# Patient Record
Sex: Female | Born: 2000 | Race: Black or African American | Hispanic: No | Marital: Single | Smoking: Never smoker
Health system: Southern US, Community
[De-identification: ages and names within clinical notes are randomized; demographics above are authoritative.]

## PROBLEM LIST (undated history)

## (undated) DIAGNOSIS — U071 COVID-19: Secondary | ICD-10-CM

## (undated) DIAGNOSIS — L309 Dermatitis, unspecified: Secondary | ICD-10-CM

## (undated) DIAGNOSIS — R519 Headache, unspecified: Secondary | ICD-10-CM

## (undated) DIAGNOSIS — J45909 Unspecified asthma, uncomplicated: Secondary | ICD-10-CM

## (undated) HISTORY — DX: Unspecified asthma, uncomplicated: J45.909

## (undated) HISTORY — DX: Dermatitis, unspecified: L30.9

## (undated) HISTORY — DX: Headache, unspecified: R51.9

## (undated) HISTORY — PX: TYMPANOSTOMY TUBE PLACEMENT: SHX32

---
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
## 2018-01-11 ENCOUNTER — Other Ambulatory Visit: Payer: Self-pay

## 2018-01-11 DIAGNOSIS — J4540 Moderate persistent asthma, uncomplicated: Secondary | ICD-10-CM

## 2018-01-11 MED ORDER — ALBUTEROL SULFATE HFA 108 (90 BASE) MCG/ACT IN AERS: INHALATION_SPRAY | RESPIRATORY_TRACT | 1 refills | Status: DC

## 2018-01-15 ENCOUNTER — Other Ambulatory Visit: Payer: Self-pay

## 2018-01-19 NOTE — Progress Notes (Signed)
VIALS EXP 01-21-19 

## 2018-01-19 NOTE — Addendum Note (Signed)
Addended by: Lorrin MaisPADGETT, Cedar Ditullio P on: 01/19/2018 12:56 PM   Modules accepted: Orders

## 2018-01-20 ENCOUNTER — Telehealth: Payer: Self-pay | Admitting: Allergy

## 2018-01-20 DIAGNOSIS — J4540 Moderate persistent asthma, uncomplicated: Secondary | ICD-10-CM

## 2018-01-20 DIAGNOSIS — H1010 Acute atopic conjunctivitis, unspecified eye: Secondary | ICD-10-CM

## 2018-01-20 DIAGNOSIS — J301 Allergic rhinitis due to pollen: Secondary | ICD-10-CM | POA: Diagnosis not present

## 2018-01-20 DIAGNOSIS — J309 Allergic rhinitis, unspecified: Secondary | ICD-10-CM

## 2018-01-20 MED ORDER — ALBUTEROL SULFATE HFA 108 (90 BASE) MCG/ACT IN AERS: INHALATION_SPRAY | RESPIRATORY_TRACT | 1 refills | Status: DC

## 2018-01-20 MED ORDER — OLOPATADINE HCL 0.2 % OP SOLN: 1.0000 [drp] | Freq: Every day | OPHTHALMIC | 3 refills | Status: DC

## 2018-01-20 MED ORDER — LEVOCETIRIZINE DIHYDROCHLORIDE 5 MG PO TABS: 5.0000 mg | ORAL_TABLET | Freq: Every evening | ORAL | 5 refills | Status: DC

## 2018-01-20 MED ORDER — MONTELUKAST SODIUM 10 MG PO TABS: 10.0000 mg | ORAL_TABLET | Freq: Every day | ORAL | 3 refills | Status: DC

## 2018-01-20 MED ORDER — ALBUTEROL SULFATE (2.5 MG/3ML) 0.083% IN NEBU: INHALATION_SOLUTION | RESPIRATORY_TRACT | 0 refills | Status: DC

## 2018-01-20 MED ORDER — BUDESONIDE-FORMOTEROL FUMARATE 160-4.5 MCG/ACT IN AERO: 2.0000 | INHALATION_SPRAY | Freq: Two times a day (BID) | RESPIRATORY_TRACT | 5 refills | Status: DC

## 2018-01-20 NOTE — Telephone Encounter (Signed)
[facility] [address] Sent scripts into pharmacy.

## 2018-01-20 NOTE — Telephone Encounter (Signed)
Pt mom called and said that the pharmacy sent rx to us and has not heard from us and she is out of her inhaler and having problems now. Need to send proair to [facility] [address]. [phone]

## 2018-01-21 ENCOUNTER — Other Ambulatory Visit: Payer: Self-pay | Admitting: *Deleted

## 2018-01-21 ENCOUNTER — Ambulatory Visit (INDEPENDENT_AMBULATORY_CARE_PROVIDER_SITE_OTHER): Payer: Medicaid Other | Admitting: *Deleted

## 2018-01-21 DIAGNOSIS — J309 Allergic rhinitis, unspecified: Secondary | ICD-10-CM

## 2018-01-21 MED ORDER — EPINEPHRINE 0.3 MG/0.3ML IJ SOAJ: INTRAMUSCULAR | 2 refills | Status: DC

## 2018-01-21 NOTE — Progress Notes (Signed)
Immunotherapy   Patient Details  Name: Tiffany Huffman MRN: [id number] Date of Birth: 10/31/2000  01/21/2018  Tiffany Huffman started injections for  pollen-pet/mold-cr-rw Following schedule: B  Frequency:1 time per week Epi-Pen:Prescription for Epinephrine sent in  Consent signed and patient instructions given.   Tiffany AlmMildred Cloyd Huffman 01/21/2018, 11:40 AM

## 2018-01-22 DIAGNOSIS — J3089 Other allergic rhinitis: Secondary | ICD-10-CM | POA: Diagnosis not present

## 2018-01-25 DIAGNOSIS — J3089 Other allergic rhinitis: Secondary | ICD-10-CM | POA: Diagnosis not present

## 2018-01-26 DIAGNOSIS — J301 Allergic rhinitis due to pollen: Secondary | ICD-10-CM | POA: Diagnosis not present

## 2018-01-29 ENCOUNTER — Ambulatory Visit (INDEPENDENT_AMBULATORY_CARE_PROVIDER_SITE_OTHER): Payer: Medicaid Other

## 2018-01-29 DIAGNOSIS — J309 Allergic rhinitis, unspecified: Secondary | ICD-10-CM | POA: Diagnosis not present

## 2018-02-08 IMAGING — US US RENAL
1 series · 14 of 25 positions shown · non-contrast
Comparison: None.

CLINICAL DATA: Dysuria

EXAM:
RENAL / URINARY TRACT ULTRASOUND COMPLETE

[Series 1: us renal · 0.25mm/px · 14 of 33 slices shown]
[im 1/33]
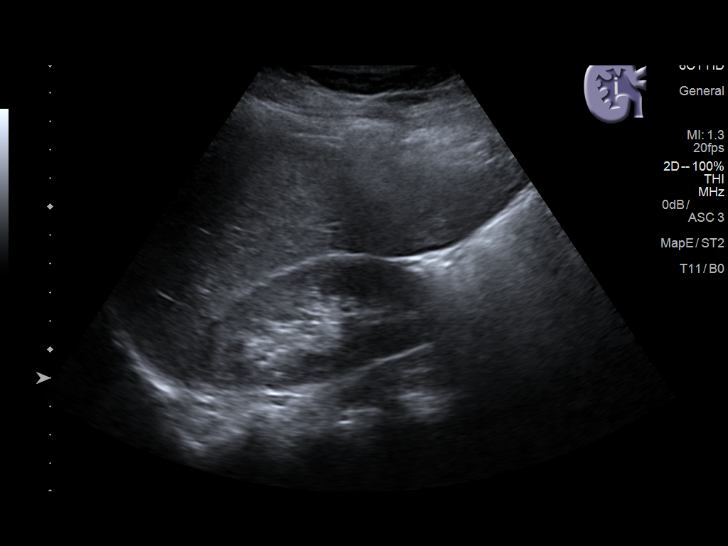
[im 3/33]
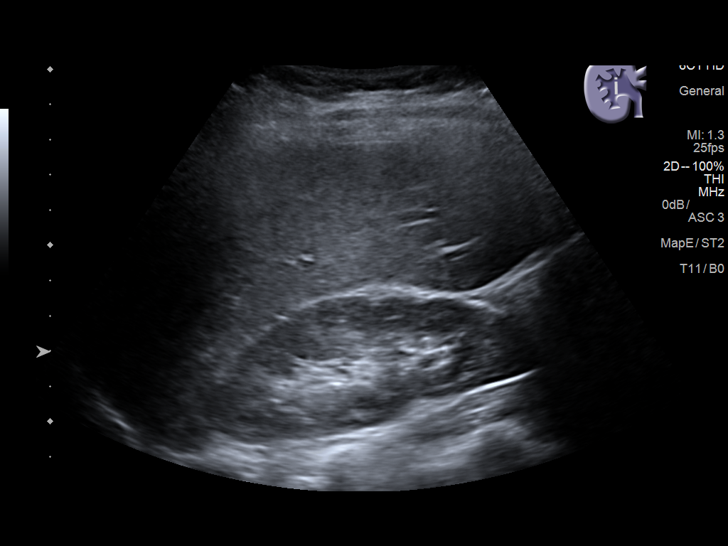
[im 6/33]
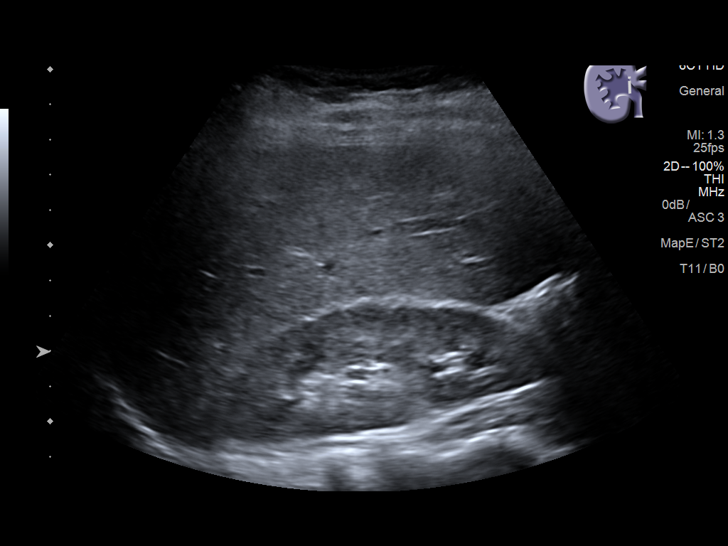
[im 9/33]
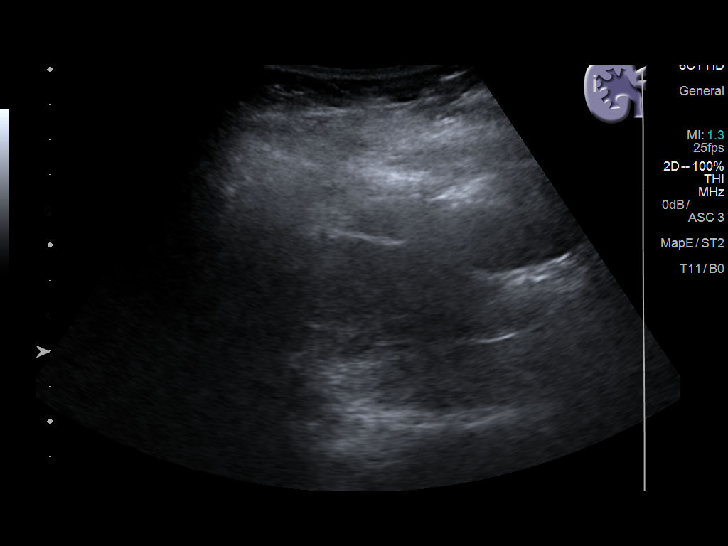
[im 11/33]
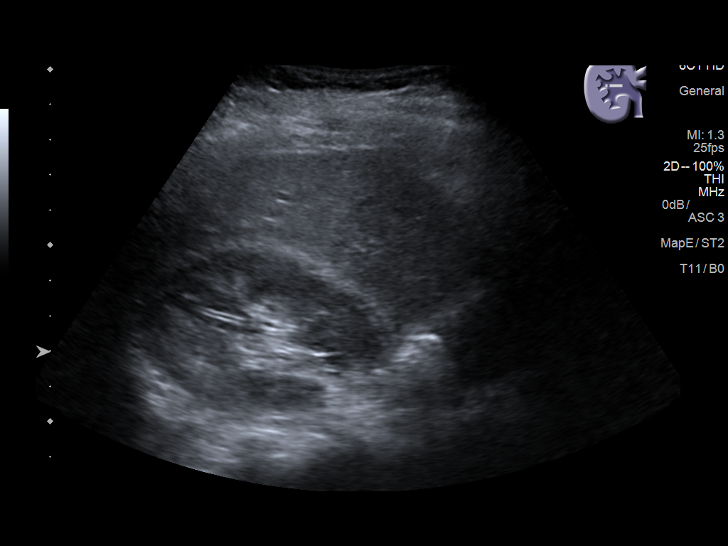
[im 13/33]
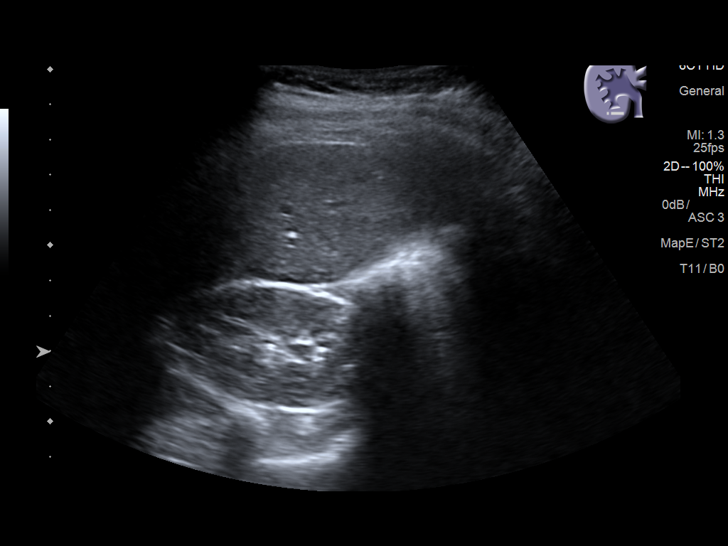
[im 15/33]
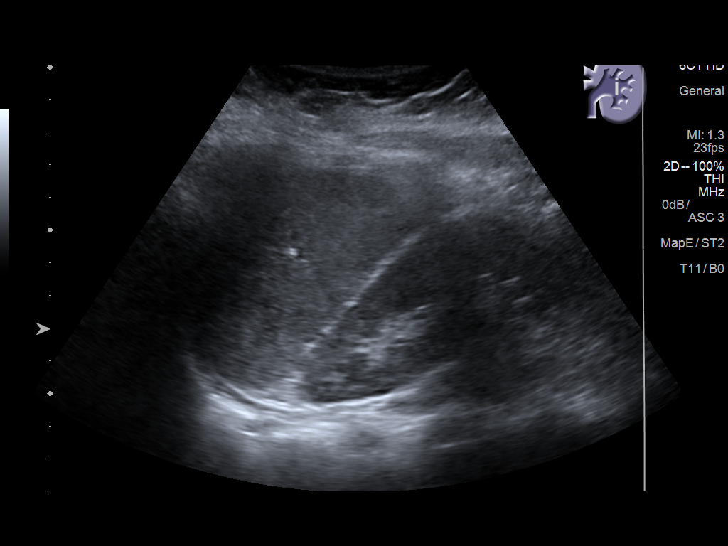
[im 18/33]
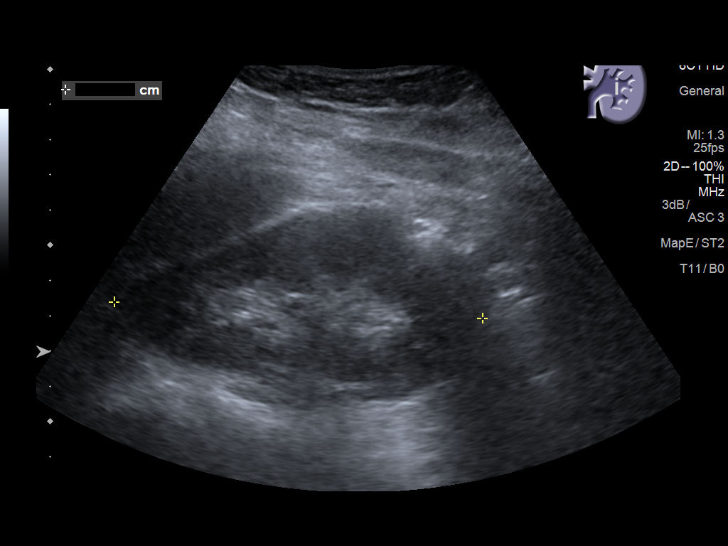
[im 21/33]
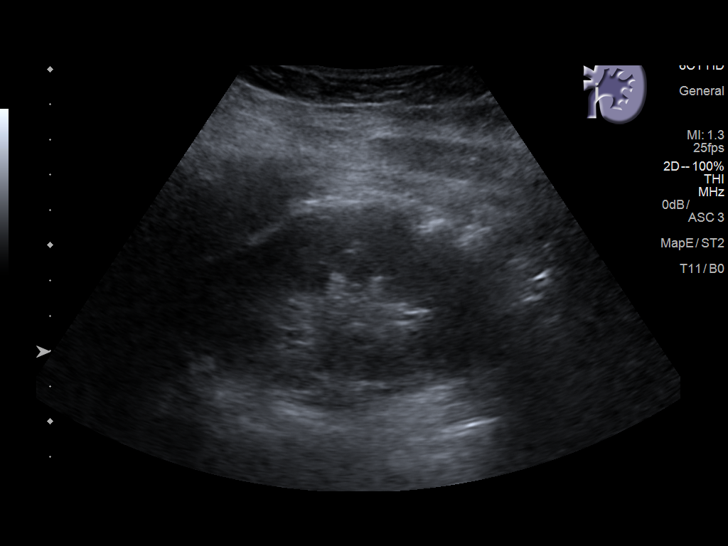
[im 22/33]
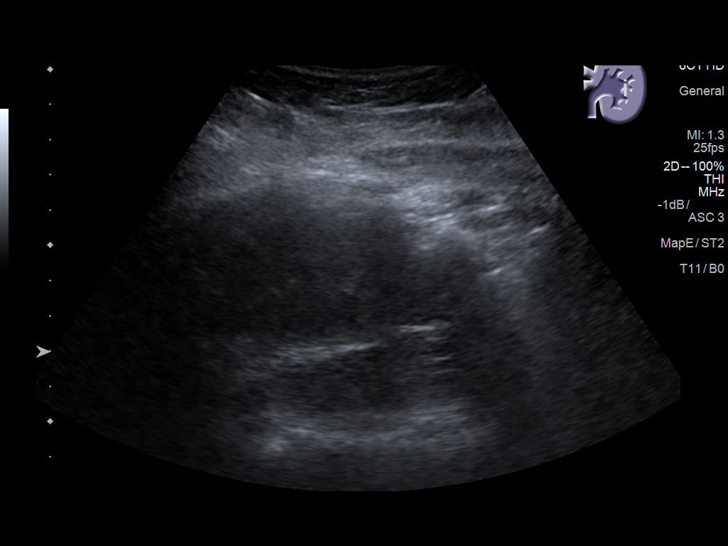
[im 25/33]
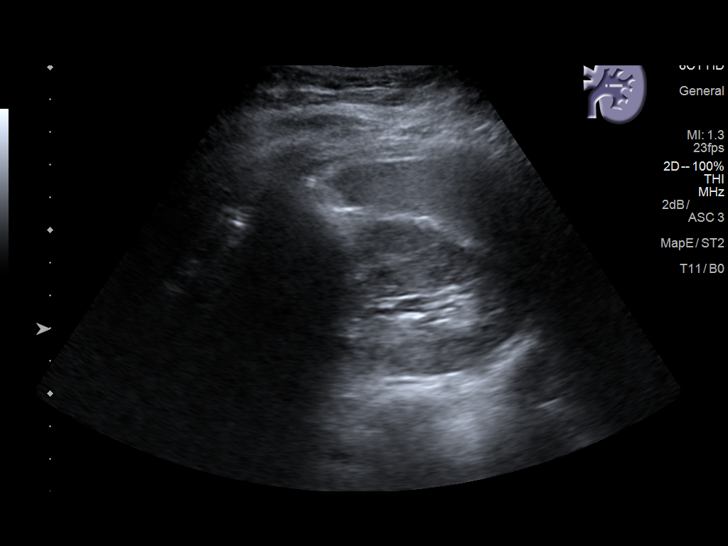
[im 27/33]
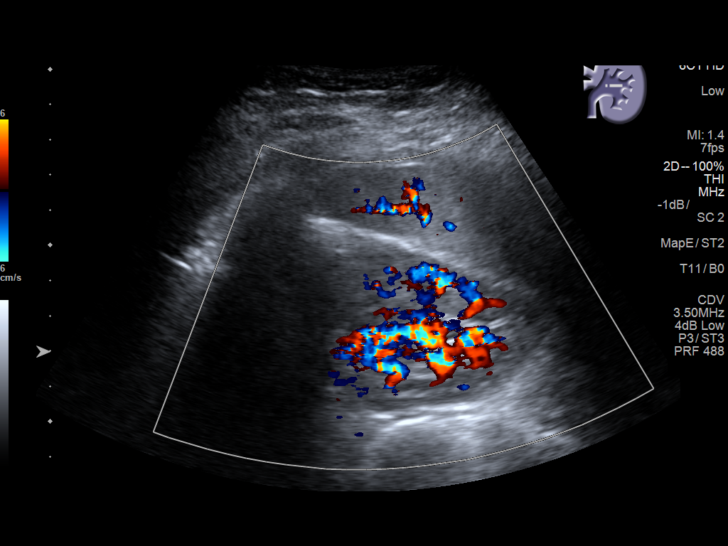
[im 30/33]
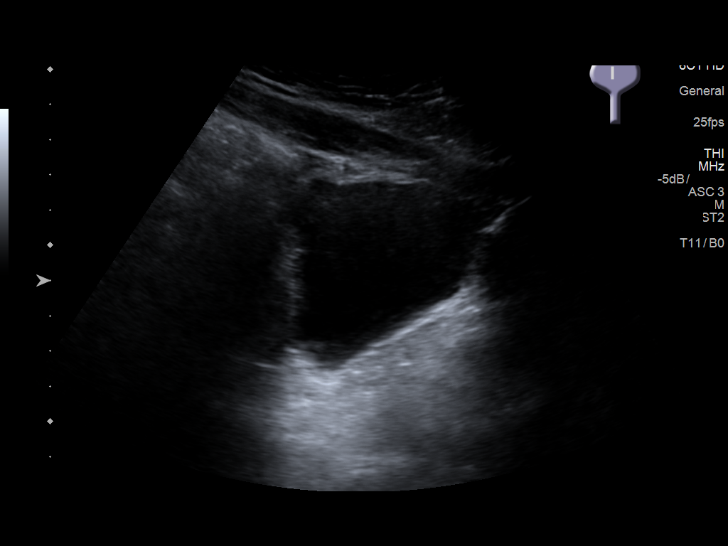
[im 33/33]
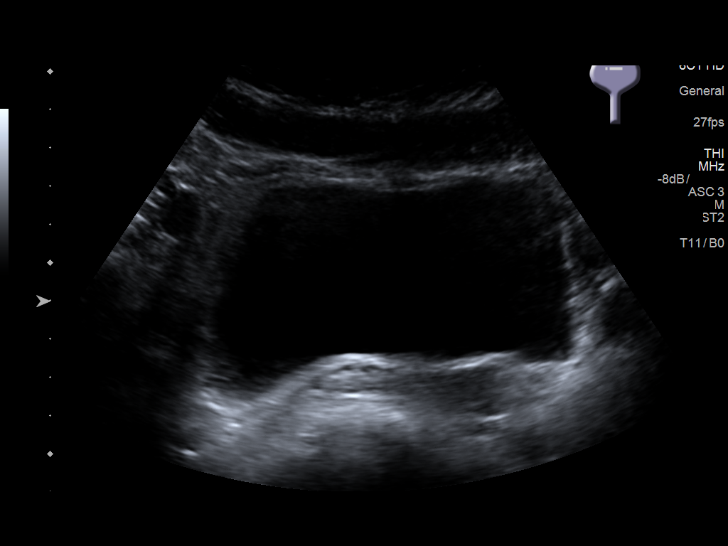

[14 of 25 positions shown; findings below may reference images not displayed]

FINDINGS: Right Kidney:

Length: 10.1 cm. Echogenicity within normal limits. No mass or
hydronephrosis visualized.

Left Kidney:

Length: 10.5 cm. Echogenicity within normal limits. No mass or
hydronephrosis visualized.

Bladder:

Appears normal for degree of bladder distention.
IMPRESSION: Negative renal ultrasound.

## 2018-02-17 ENCOUNTER — Ambulatory Visit (INDEPENDENT_AMBULATORY_CARE_PROVIDER_SITE_OTHER): Payer: Medicaid Other

## 2018-02-17 DIAGNOSIS — J309 Allergic rhinitis, unspecified: Secondary | ICD-10-CM | POA: Diagnosis not present

## 2018-02-26 ENCOUNTER — Ambulatory Visit (INDEPENDENT_AMBULATORY_CARE_PROVIDER_SITE_OTHER): Payer: Medicaid Other

## 2018-02-26 DIAGNOSIS — J309 Allergic rhinitis, unspecified: Secondary | ICD-10-CM | POA: Diagnosis not present

## 2018-02-27 ENCOUNTER — Other Ambulatory Visit (HOSPITAL_COMMUNITY)
Admission: RE | Admit: 2018-02-27 | Discharge: 2018-02-27 | Disposition: A | Discharge status: Home / Self Care | Payer: Medicaid Other | Source: Ambulatory Visit | Attending: Pediatrics | Admitting: Pediatrics

## 2018-02-27 ENCOUNTER — Other Ambulatory Visit: Payer: Self-pay

## 2018-02-27 DIAGNOSIS — B9689 Other specified bacterial agents as the cause of diseases classified elsewhere: Secondary | ICD-10-CM | POA: Diagnosis not present

## 2018-02-27 DIAGNOSIS — R300 Dysuria: Secondary | ICD-10-CM | POA: Diagnosis not present

## 2018-02-27 DIAGNOSIS — N898 Other specified noninflammatory disorders of vagina: Secondary | ICD-10-CM | POA: Insufficient documentation

## 2018-02-27 DIAGNOSIS — N760 Acute vaginitis: Secondary | ICD-10-CM | POA: Diagnosis not present

## 2018-02-27 LAB — URINALYSIS, ROUTINE W REFLEX MICROSCOPIC
Bilirubin Urine: NEGATIVE
Glucose, UA: NEGATIVE mg/dL
Hgb urine dipstick: NEGATIVE
Ketones, ur: NEGATIVE mg/dL
Nitrite: NEGATIVE
Protein, ur: NEGATIVE mg/dL
SPECIFIC GRAVITY, URINE: 1.015 (ref 1.005–1.030)
pH: 7 (ref 5.0–8.0)

## 2018-02-28 LAB — URINE CULTURE

## 2018-03-08 ENCOUNTER — Encounter (HOSPITAL_COMMUNITY): Payer: Self-pay | Admitting: Emergency Medicine

## 2018-03-08 ENCOUNTER — Emergency Department (HOSPITAL_COMMUNITY)
Admission: EM | Admit: 2018-03-08 | Discharge: 2018-03-08 | Disposition: A | Discharge status: Home / Self Care | Payer: Medicaid Other | Attending: Emergency Medicine | Admitting: Emergency Medicine

## 2018-03-08 ENCOUNTER — Other Ambulatory Visit: Payer: Self-pay

## 2018-03-08 DIAGNOSIS — Z5321 Procedure and treatment not carried out due to patient leaving prior to being seen by health care provider: Secondary | ICD-10-CM | POA: Insufficient documentation

## 2018-03-08 DIAGNOSIS — R1030 Lower abdominal pain, unspecified: Secondary | ICD-10-CM | POA: Insufficient documentation

## 2018-03-08 LAB — CBC
HCT: 41.2 % (ref 36.0–46.0)
Hemoglobin: 13.4 g/dL (ref 12.0–15.0)
MCH: 30.1 pg (ref 26.0–34.0)
MCHC: 32.5 g/dL (ref 30.0–36.0)
MCV: 92.6 fL (ref 78.0–100.0)
PLATELETS: 277 10*3/uL (ref 150–400)
RBC: 4.45 MIL/uL (ref 3.87–5.11)
RDW: 12.5 % (ref 11.5–15.5)
WBC: 10 10*3/uL (ref 4.0–10.5)

## 2018-03-08 LAB — I-STAT BETA HCG BLOOD, ED (MC, WL, AP ONLY): I-stat hCG, quantitative: <5 m[IU]/mL (ref <5)

## 2018-03-08 LAB — URINALYSIS, ROUTINE W REFLEX MICROSCOPIC
Bilirubin Urine: NEGATIVE
GLUCOSE, UA: NEGATIVE mg/dL
Hgb urine dipstick: NEGATIVE
KETONES UR: NEGATIVE mg/dL
LEUKOCYTES UA: NEGATIVE
Nitrite: NEGATIVE
PROTEIN: NEGATIVE mg/dL
Specific Gravity, Urine: 1.024 (ref 1.005–1.030)
pH: 6 (ref 5.0–8.0)

## 2018-03-08 LAB — COMPREHENSIVE METABOLIC PANEL
ALK PHOS: 52 U/L (ref 38–126)
ALT: 19 U/L (ref 0–44)
AST: 16 U/L (ref 15–41)
Albumin: 3.6 g/dL (ref 3.5–5.0)
Anion gap: 9 (ref 5–15)
BILIRUBIN TOTAL: 0.6 mg/dL (ref 0.3–1.2)
BUN: 8 mg/dL (ref 6–20)
CO2: 28 mmol/L (ref 22–32)
CREATININE: 0.83 mg/dL (ref 0.44–1.00)
Calcium: 9 mg/dL (ref 8.9–10.3)
Chloride: 103 mmol/L (ref 98–111)
GFR calc Af Amer: >60 mL/min (ref >60)
Glucose, Bld: 75 mg/dL (ref 70–99)
Potassium: 3.7 mmol/L (ref 3.5–5.1)
Sodium: 140 mmol/L (ref 135–145)
TOTAL PROTEIN: 6.9 g/dL (ref 6.5–8.1)

## 2018-03-08 LAB — LIPASE, BLOOD: Lipase: 32 U/L (ref 11–51)

## 2018-03-08 NOTE — ED Notes (Signed)
Pt came to front desk and asked if she could get her results sent to her and that she was leaving. Pt encouraged to stay but elected to leave and explained how to get into the mychart portal. Pt observed to be ambulating out of the ER NAD.

## 2018-03-08 NOTE — ED Triage Notes (Signed)
Pt c/o lower abdominal pain and back pain x 2 weeks. Pt reports that she was diagnosed with bacterial vaginosis, started on abx, reports pelvic pressure and frequent urination.

## 2018-03-09 NOTE — ED Notes (Signed)
Follow up call made  Pt will return if needed  03/09/18  1041  s Elania Crowl rn

## 2018-03-14 ENCOUNTER — Other Ambulatory Visit: Payer: Self-pay | Admitting: Allergy

## 2018-03-14 DIAGNOSIS — J4540 Moderate persistent asthma, uncomplicated: Secondary | ICD-10-CM

## 2018-03-16 ENCOUNTER — Ambulatory Visit (INDEPENDENT_AMBULATORY_CARE_PROVIDER_SITE_OTHER): Payer: Medicaid Other

## 2018-03-16 DIAGNOSIS — J309 Allergic rhinitis, unspecified: Secondary | ICD-10-CM

## 2018-03-24 ENCOUNTER — Ambulatory Visit (INDEPENDENT_AMBULATORY_CARE_PROVIDER_SITE_OTHER): Payer: Medicaid Other | Admitting: *Deleted

## 2018-03-24 ENCOUNTER — Other Ambulatory Visit: Payer: Self-pay | Admitting: Allergy & Immunology

## 2018-03-24 DIAGNOSIS — H1010 Acute atopic conjunctivitis, unspecified eye: Secondary | ICD-10-CM

## 2018-03-24 DIAGNOSIS — J309 Allergic rhinitis, unspecified: Secondary | ICD-10-CM | POA: Diagnosis not present

## 2018-03-31 ENCOUNTER — Encounter: Payer: Self-pay | Admitting: Family Medicine

## 2018-03-31 ENCOUNTER — Ambulatory Visit (INDEPENDENT_AMBULATORY_CARE_PROVIDER_SITE_OTHER): Payer: Medicaid Other | Admitting: Family Medicine

## 2018-03-31 ENCOUNTER — Other Ambulatory Visit (HOSPITAL_COMMUNITY)
Admission: RE | Admit: 2018-03-31 | Discharge: 2018-03-31 | Disposition: A | Discharge status: Home / Self Care | Payer: Medicaid Other | Source: Ambulatory Visit | Attending: Family Medicine | Admitting: Family Medicine

## 2018-03-31 VITALS — BP 126/73 | HR 72 | Wt 212.0 lb

## 2018-03-31 DIAGNOSIS — Z113 Encounter for screening for infections with a predominantly sexual mode of transmission: Secondary | ICD-10-CM

## 2018-03-31 DIAGNOSIS — R102 Pelvic and perineal pain unspecified side: Secondary | ICD-10-CM

## 2018-03-31 DIAGNOSIS — Z3009 Encounter for other general counseling and advice on contraception: Secondary | ICD-10-CM

## 2018-03-31 LAB — URINE CYTOLOGY ANCILLARY ONLY
Chlamydia: NEGATIVE
Neisseria Gonorrhea: NEGATIVE

## 2018-03-31 NOTE — Patient Instructions (Addendum)
You have constipation which is hard stools that are difficult to pass. It is important to have regular bowel movements every 1-3 days that are soft and easy to pass. Hard stools increase your risk of hemorrhoids and are very uncomfortable.   To prevent constipation you can increase the amount of fiber in your diet. Examples of foods with fiber are leafy greens, whole grain breads, oatmeal and other grains.  It is also important to drink at least eight 8oz glass of water everyday.   If you have not has a bowel movement in 4-5 days you made need to clean out your bowel.  This will have establish normal movement through your bowel.    Miralax Clean out  Take 8 capfuls of miralax in 64 oz of gatorade. You can use any fluid that appeals to you (gatorade, water, juice)  Continue to drink at least eight 8 oz glasses of water throughout the day  You can repeat with another 8 capfuls of miralax in 64 oz of gatorade if you are not having a large amount of stools  You will need to be at home and close to a bathroom for about 8 hours when you do the above as you may need to go to the bathroom frequently.   After you are cleaned out: - Start Colace100mg  twice daily - Start Miralax once daily - Start a daily fiber supplement like metamucil or citrucel - You can safely use enemas in pregnancy  - if you are having diarrhea you can reduce to Colace once a day or miralax every other day or a 1/2 capful daily.       Vaginitis Vaginitis is a condition in which the vaginal tissue swells and becomes red (inflamed). This condition is most often caused by a change in the normal balance of bacteria and yeast that live in the vagina. This change causes an overgrowth of certain bacteria or yeast, which causes the inflammation. There are different types of vaginitis, but the most common types are:  Bacterial vaginosis.  Yeast infection (candidiasis).  Trichomoniasis vaginitis. This is a sexually transmitted  disease (STD).  Viral vaginitis.  Atrophic vaginitis.  Allergic vaginitis.  What are the causes? The cause of this condition depends on the type of vaginitis. It can be caused by:  Bacteria (bacterial vaginosis).  Yeast, which is a fungus (yeast infection).  A parasite (trichomoniasis vaginitis).  A virus (viral vaginitis).  Low hormone levels (atrophic vaginitis). Low hormone levels can occur during pregnancy, breastfeeding, or after menopause.  Irritants, such as bubble baths, scented tampons, and feminine sprays (allergic vaginitis).  Other factors can change the normal balance of the yeast and bacteria that live in the vagina. These include:  Antibiotic medicines.  Poor hygiene.  Diaphragms, vaginal sponges, spermicides, birth control pills, and intrauterine devices (IUD).  Sex.  Infection.  Uncontrolled diabetes.  A weakened defense (immune) system.  What increases the risk? This condition is more likely to develop in women who:  Smoke.  Use vaginal douches, scented tampons, or scented sanitary pads.  Wear tight-fitting pants.  Wear thong underwear.  Use oral birth control pills or an IUD.  Have sex without a condom.  Have multiple sex partners.  Have an STD.  Frequently use the spermicide nonoxynol-9.  Eat lots of foods high in sugar.  Have uncontrolled diabetes.  Have low estrogen levels.  Have a weakened immune system from an immune disorder or medical treatment.  Are pregnant or breastfeeding.  What  are the signs or symptoms? Symptoms vary depending on the cause of the vaginitis. Common symptoms include:  Abnormal vaginal discharge. ? The discharge is white, gray, or yellow with bacterial vaginosis. ? The discharge is thick, white, and cheesy with a yeast infection. ? The discharge is frothy and yellow or greenish with trichomoniasis.  A bad vaginal smell. The smell is fishy with bacterial vaginosis.  Vaginal itching, pain, or  swelling.  Sex that is painful.  Pain or burning when urinating.  Sometimes there are no symptoms. How is this diagnosed? This condition is diagnosed based on your symptoms and medical history. A physical exam, including a pelvic exam, will also be done. You may also have other tests, including:  Tests to determine the pH level (acidity or alkalinity) of your vagina.  A whiff test, to assess the odor that results when a sample of your vaginal discharge is mixed with a potassium hydroxide solution.  Tests of vaginal fluid. A sample will be examined under a microscope.  How is this treated? Treatment varies depending on the type of vaginitis you have. Your treatment may include:  Antibiotic creams or pills to treat bacterial vaginosis and trichomoniasis.  Antifungal medicines, such as vaginal creams or suppositories, to treat a yeast infection.  Medicine to ease discomfort if you have viral vaginitis. Your sexual partner should also be treated.  Estrogen delivered in a cream, pill, suppository, or vaginal ring to treat atrophic vaginitis. If vaginal dryness occurs, lubricants and moisturizing creams may help. You may need to avoid scented soaps, sprays, or douches.  Stopping use of a product that is causing allergic vaginitis. Then using a vaginal cream to treat the symptoms.  Follow these instructions at home: Lifestyle  Keep your genital area clean and dry. Avoid soap, and only rinse the area with water.  Do not douche or use tampons until your health care provider says it is okay to do so. Use sanitary pads, if needed.  Do not have sex until your health care provider approves. When you can return to sex, practice safe sex and use condoms.  Wipe from front to back. This avoids the spread of bacteria from the rectum to the vagina. General instructions  Take over-the-counter and prescription medicines only as told by your health care provider.  If you were prescribed an  antibiotic medicine, take or use it as told by your health care provider. Do not stop taking or using the antibiotic even if you start to feel better.  Keep all follow-up visits as told by your health care provider. This is important. How is this prevented?  Use mild, non-scented products. Do not use things that can irritate the vagina, such as fabric softeners. Avoid the following products if they are scented: ? Feminine sprays. ? Detergents. ? Tampons. ? Feminine hygiene products. ? Soaps or bubble baths.  Let air reach your genital area. ? Wear cotton underwear to reduce moisture buildup. ? Avoid wearing underwear while you sleep. ? Avoid wearing tight pants and underwear or nylons without a cotton panel. ? Avoid wearing thong underwear.  Take off any wet clothing, such as bathing suits, as soon as possible.  Practice safe sex and use condoms. Contact a health care provider if:  You have abdominal pain.  You have a fever.  You have symptoms that last for more than 2-3 days. Get help right away if:  You have a fever and your symptoms suddenly get worse. Summary  Vaginitis is a  condition in which the vaginal tissue becomes inflamed.This condition is most often caused by a change in the normal balance of bacteria and yeast that live in the vagina.  Treatment varies depending on the type of vaginitis you have.  Do not douche, use tampons , or have sex until your health care provider approves. When you can return to sex, practice safe sex and use condoms. This information is not intended to replace advice given to you by your health care provider. Make sure you discuss any questions you have with your health care provider. Document Released: 05/18/2007 Document Revised: 08/26/2016 Document Reviewed: 08/26/2016 Elsevier Interactive Patient Education  [employer].

## 2018-03-31 NOTE — Progress Notes (Signed)
   GYNECOLOGY ANNUAL PREVENTATIVE CARE ENCOUNTER NOTE  Subjective:   Tiffany Huffman is a 17 y.o. [place]. female here for a a problem GYN visit.  Current complaints: vaginal pain.   Denies abnormal vaginal bleeding, discharge, pelvic pain, problems with intercourse or other gynecologic concerns.    Gynecologic History Patient's last menstrual period was 03/20/2018 (approximate). Contraception: OCP (estrogen/progesterone) Last Pap: NA--> at 6721 Last mammogram: NA  The following portions of the patient's history were reviewed and updated as appropriate: allergies, current medications, past family history, past medical history, past social history, past surgical history and problem list.  Review of Systems Pertinent items are noted in HPI.   Objective:  BP 126/73   Pulse 72   Wt 212 lb (96.2 kg)   LMP 03/20/2018 (Approximate)   BMI 35.28 kg/m  Gen: well appearing, NAD HEENT: no scleral icterus CV: RR Lung: Normal WOB Ext: warm well perfused GU: + vaginal discharge, white and homogenous. Normal appearing vaginal mucosa and cervix. Normal sized uterus. No adnexal masses.   Assessment and Plan:   1. Vaginal pain/Irritation - Supected BV based on adherent white discharge, no odor on exam. Will await results before trt. Could also possibly be related to general atopy (based on asthma and eczema).  - Cervicovaginal ancillary only  2. Routine screening for STI (sexually transmitted infection) - HIV antibody (with reflex)  3. Contraception: does not desire pregnancy in the next year, continue OCP  Please refer to After Visit Summary for other counseling recommendations.   Return if symptoms worsen or fail to improve.  Federico FlakeKimberly Niles Newton, MD, MPH, ABFM Attending Physician Faculty Practice- [facility]

## 2018-04-01 LAB — HIV ANTIBODY (ROUTINE TESTING W REFLEX): HIV SCREEN 4TH GENERATION: NONREACTIVE

## 2018-04-02 LAB — CERVICOVAGINAL ANCILLARY ONLY
Bacterial vaginitis: NEGATIVE
CANDIDA VAGINITIS: NEGATIVE
CHLAMYDIA, DNA PROBE: NEGATIVE
NEISSERIA GONORRHEA: NEGATIVE
Trichomonas: NEGATIVE

## 2018-04-06 ENCOUNTER — Telehealth: Payer: Self-pay

## 2018-04-06 ENCOUNTER — Ambulatory Visit (INDEPENDENT_AMBULATORY_CARE_PROVIDER_SITE_OTHER): Payer: Medicaid Other | Admitting: *Deleted

## 2018-04-06 DIAGNOSIS — J309 Allergic rhinitis, unspecified: Secondary | ICD-10-CM

## 2018-04-06 NOTE — Telephone Encounter (Signed)
Patient inform of normal test result. She complains of still having pain at this time. She plans on doing the miralax cleanse this weekend. I have advised her to try that to see if she gets some relief.

## 2018-04-06 NOTE — Telephone Encounter (Signed)
-----   Message from Lindell Spar, [place] sent at 04/06/2018  8:39 AM EDT ----- Regarding: labs neg/ patient has question Contact: [phone] All labs are negative, however patient still wanting to know about vag pain

## 2018-04-06 NOTE — Telephone Encounter (Signed)
Spoke with patient concerning lab work. 

## 2018-04-06 NOTE — Telephone Encounter (Signed)
-----   Message from Heather L Bacon, NT sent at 04/06/2018  8:39 AM EDT ----- Regarding: labs neg/ patient has question Contact: [phone] All labs are negative, however patient still wanting to know about vag pain  

## 2018-04-07 ENCOUNTER — Ambulatory Visit: Payer: Medicaid Other | Admitting: Family Medicine

## 2018-04-13 ENCOUNTER — Ambulatory Visit (INDEPENDENT_AMBULATORY_CARE_PROVIDER_SITE_OTHER): Payer: Medicaid Other | Admitting: *Deleted

## 2018-04-13 DIAGNOSIS — J309 Allergic rhinitis, unspecified: Secondary | ICD-10-CM | POA: Diagnosis not present

## 2018-04-20 ENCOUNTER — Ambulatory Visit (INDEPENDENT_AMBULATORY_CARE_PROVIDER_SITE_OTHER): Payer: Medicaid Other | Admitting: *Deleted

## 2018-04-20 DIAGNOSIS — J309 Allergic rhinitis, unspecified: Secondary | ICD-10-CM

## 2018-05-06 ENCOUNTER — Ambulatory Visit (INDEPENDENT_AMBULATORY_CARE_PROVIDER_SITE_OTHER): Payer: Medicaid Other | Admitting: *Deleted

## 2018-05-06 DIAGNOSIS — J309 Allergic rhinitis, unspecified: Secondary | ICD-10-CM | POA: Diagnosis not present

## 2018-05-14 ENCOUNTER — Ambulatory Visit (INDEPENDENT_AMBULATORY_CARE_PROVIDER_SITE_OTHER): Payer: Medicaid Other

## 2018-05-14 DIAGNOSIS — J309 Allergic rhinitis, unspecified: Secondary | ICD-10-CM | POA: Diagnosis not present

## 2018-05-20 ENCOUNTER — Other Ambulatory Visit: Payer: Self-pay | Admitting: Allergy

## 2018-05-20 ENCOUNTER — Ambulatory Visit (INDEPENDENT_AMBULATORY_CARE_PROVIDER_SITE_OTHER): Payer: Medicaid Other | Admitting: *Deleted

## 2018-05-20 DIAGNOSIS — J309 Allergic rhinitis, unspecified: Secondary | ICD-10-CM

## 2018-05-20 DIAGNOSIS — J4540 Moderate persistent asthma, uncomplicated: Secondary | ICD-10-CM

## 2018-05-20 NOTE — Telephone Encounter (Signed)
Courtesy refill  

## 2018-06-02 ENCOUNTER — Ambulatory Visit (INDEPENDENT_AMBULATORY_CARE_PROVIDER_SITE_OTHER): Payer: Medicaid Other | Admitting: *Deleted

## 2018-06-02 DIAGNOSIS — J309 Allergic rhinitis, unspecified: Secondary | ICD-10-CM | POA: Diagnosis not present

## 2018-06-17 ENCOUNTER — Other Ambulatory Visit: Payer: Self-pay | Admitting: Allergy

## 2018-06-17 DIAGNOSIS — J4540 Moderate persistent asthma, uncomplicated: Secondary | ICD-10-CM

## 2018-06-17 NOTE — Telephone Encounter (Signed)
Courtesy refill  

## 2018-06-24 ENCOUNTER — Ambulatory Visit (INDEPENDENT_AMBULATORY_CARE_PROVIDER_SITE_OTHER): Payer: Medicaid Other | Admitting: *Deleted

## 2018-06-24 DIAGNOSIS — J309 Allergic rhinitis, unspecified: Secondary | ICD-10-CM

## 2018-07-08 ENCOUNTER — Ambulatory Visit (INDEPENDENT_AMBULATORY_CARE_PROVIDER_SITE_OTHER): Payer: Medicaid Other | Admitting: *Deleted

## 2018-07-08 DIAGNOSIS — J309 Allergic rhinitis, unspecified: Secondary | ICD-10-CM

## 2018-07-15 ENCOUNTER — Ambulatory Visit (INDEPENDENT_AMBULATORY_CARE_PROVIDER_SITE_OTHER): Payer: Medicaid Other | Admitting: *Deleted

## 2018-07-15 ENCOUNTER — Other Ambulatory Visit: Payer: Self-pay | Admitting: Allergy

## 2018-07-15 DIAGNOSIS — J309 Allergic rhinitis, unspecified: Secondary | ICD-10-CM

## 2018-07-15 DIAGNOSIS — J4540 Moderate persistent asthma, uncomplicated: Secondary | ICD-10-CM

## 2018-07-22 ENCOUNTER — Ambulatory Visit (INDEPENDENT_AMBULATORY_CARE_PROVIDER_SITE_OTHER): Payer: Medicaid Other | Admitting: *Deleted

## 2018-07-22 DIAGNOSIS — J309 Allergic rhinitis, unspecified: Secondary | ICD-10-CM | POA: Diagnosis not present

## 2018-07-30 ENCOUNTER — Ambulatory Visit (INDEPENDENT_AMBULATORY_CARE_PROVIDER_SITE_OTHER): Payer: Medicaid Other | Admitting: *Deleted

## 2018-07-30 DIAGNOSIS — J309 Allergic rhinitis, unspecified: Secondary | ICD-10-CM | POA: Diagnosis not present

## 2018-08-12 ENCOUNTER — Ambulatory Visit (INDEPENDENT_AMBULATORY_CARE_PROVIDER_SITE_OTHER): Payer: Medicaid Other | Admitting: *Deleted

## 2018-08-12 DIAGNOSIS — J309 Allergic rhinitis, unspecified: Secondary | ICD-10-CM

## 2018-08-18 ENCOUNTER — Ambulatory Visit (INDEPENDENT_AMBULATORY_CARE_PROVIDER_SITE_OTHER): Payer: Medicaid Other | Admitting: *Deleted

## 2018-08-18 DIAGNOSIS — J309 Allergic rhinitis, unspecified: Secondary | ICD-10-CM | POA: Diagnosis not present

## 2018-08-27 ENCOUNTER — Ambulatory Visit (INDEPENDENT_AMBULATORY_CARE_PROVIDER_SITE_OTHER): Payer: Medicaid Other

## 2018-08-27 DIAGNOSIS — J309 Allergic rhinitis, unspecified: Secondary | ICD-10-CM

## 2018-09-02 ENCOUNTER — Ambulatory Visit (INDEPENDENT_AMBULATORY_CARE_PROVIDER_SITE_OTHER): Payer: Medicaid Other | Admitting: *Deleted

## 2018-09-02 DIAGNOSIS — J309 Allergic rhinitis, unspecified: Secondary | ICD-10-CM

## 2018-09-17 ENCOUNTER — Ambulatory Visit (INDEPENDENT_AMBULATORY_CARE_PROVIDER_SITE_OTHER): Payer: Medicaid Other | Admitting: *Deleted

## 2018-09-17 DIAGNOSIS — J309 Allergic rhinitis, unspecified: Secondary | ICD-10-CM

## 2018-09-30 ENCOUNTER — Ambulatory Visit (INDEPENDENT_AMBULATORY_CARE_PROVIDER_SITE_OTHER): Payer: Medicaid Other | Admitting: *Deleted

## 2018-09-30 DIAGNOSIS — J309 Allergic rhinitis, unspecified: Secondary | ICD-10-CM

## 2018-10-21 ENCOUNTER — Ambulatory Visit (INDEPENDENT_AMBULATORY_CARE_PROVIDER_SITE_OTHER): Payer: Self-pay

## 2018-10-21 DIAGNOSIS — J309 Allergic rhinitis, unspecified: Secondary | ICD-10-CM

## 2018-11-05 ENCOUNTER — Ambulatory Visit (INDEPENDENT_AMBULATORY_CARE_PROVIDER_SITE_OTHER): Payer: Self-pay | Admitting: *Deleted

## 2018-11-05 DIAGNOSIS — J309 Allergic rhinitis, unspecified: Secondary | ICD-10-CM

## 2018-11-19 ENCOUNTER — Ambulatory Visit (INDEPENDENT_AMBULATORY_CARE_PROVIDER_SITE_OTHER): Payer: Self-pay | Admitting: *Deleted

## 2018-11-19 DIAGNOSIS — J309 Allergic rhinitis, unspecified: Secondary | ICD-10-CM

## 2018-11-23 ENCOUNTER — Ambulatory Visit (INDEPENDENT_AMBULATORY_CARE_PROVIDER_SITE_OTHER): Payer: Self-pay | Admitting: *Deleted

## 2018-11-23 DIAGNOSIS — J309 Allergic rhinitis, unspecified: Secondary | ICD-10-CM

## 2018-12-09 ENCOUNTER — Ambulatory Visit (INDEPENDENT_AMBULATORY_CARE_PROVIDER_SITE_OTHER): Payer: Self-pay

## 2018-12-09 DIAGNOSIS — J309 Allergic rhinitis, unspecified: Secondary | ICD-10-CM

## 2018-12-15 NOTE — Progress Notes (Signed)
VIALS EXP 12-15-2019 

## 2018-12-16 DIAGNOSIS — J3089 Other allergic rhinitis: Secondary | ICD-10-CM

## 2018-12-22 ENCOUNTER — Ambulatory Visit (INDEPENDENT_AMBULATORY_CARE_PROVIDER_SITE_OTHER): Payer: Self-pay | Admitting: *Deleted

## 2018-12-22 DIAGNOSIS — J309 Allergic rhinitis, unspecified: Secondary | ICD-10-CM

## 2019-01-06 ENCOUNTER — Ambulatory Visit (INDEPENDENT_AMBULATORY_CARE_PROVIDER_SITE_OTHER): Payer: Self-pay

## 2019-01-06 DIAGNOSIS — J309 Allergic rhinitis, unspecified: Secondary | ICD-10-CM

## 2019-01-13 ENCOUNTER — Ambulatory Visit (INDEPENDENT_AMBULATORY_CARE_PROVIDER_SITE_OTHER): Payer: Self-pay | Admitting: *Deleted

## 2019-01-13 DIAGNOSIS — J309 Allergic rhinitis, unspecified: Secondary | ICD-10-CM

## 2019-01-24 ENCOUNTER — Ambulatory Visit (INDEPENDENT_AMBULATORY_CARE_PROVIDER_SITE_OTHER): Payer: Self-pay

## 2019-01-24 DIAGNOSIS — J309 Allergic rhinitis, unspecified: Secondary | ICD-10-CM

## 2019-02-03 ENCOUNTER — Ambulatory Visit (INDEPENDENT_AMBULATORY_CARE_PROVIDER_SITE_OTHER): Payer: Self-pay | Admitting: *Deleted

## 2019-02-03 DIAGNOSIS — J309 Allergic rhinitis, unspecified: Secondary | ICD-10-CM

## 2019-02-16 ENCOUNTER — Ambulatory Visit (INDEPENDENT_AMBULATORY_CARE_PROVIDER_SITE_OTHER): Payer: Self-pay | Admitting: *Deleted

## 2019-02-16 DIAGNOSIS — J309 Allergic rhinitis, unspecified: Secondary | ICD-10-CM

## 2019-02-28 ENCOUNTER — Ambulatory Visit (INDEPENDENT_AMBULATORY_CARE_PROVIDER_SITE_OTHER): Payer: Self-pay

## 2019-02-28 DIAGNOSIS — J309 Allergic rhinitis, unspecified: Secondary | ICD-10-CM

## 2019-03-08 ENCOUNTER — Ambulatory Visit (INDEPENDENT_AMBULATORY_CARE_PROVIDER_SITE_OTHER): Payer: Self-pay | Admitting: *Deleted

## 2019-03-08 DIAGNOSIS — J309 Allergic rhinitis, unspecified: Secondary | ICD-10-CM

## 2019-03-18 ENCOUNTER — Ambulatory Visit (INDEPENDENT_AMBULATORY_CARE_PROVIDER_SITE_OTHER): Payer: Self-pay | Admitting: *Deleted

## 2019-03-18 DIAGNOSIS — J309 Allergic rhinitis, unspecified: Secondary | ICD-10-CM

## 2019-04-01 ENCOUNTER — Ambulatory Visit (INDEPENDENT_AMBULATORY_CARE_PROVIDER_SITE_OTHER): Payer: Self-pay

## 2019-04-01 DIAGNOSIS — J309 Allergic rhinitis, unspecified: Secondary | ICD-10-CM

## 2019-04-07 ENCOUNTER — Ambulatory Visit (INDEPENDENT_AMBULATORY_CARE_PROVIDER_SITE_OTHER): Payer: Self-pay

## 2019-04-07 DIAGNOSIS — J309 Allergic rhinitis, unspecified: Secondary | ICD-10-CM

## 2019-04-12 ENCOUNTER — Telehealth: Payer: Self-pay | Admitting: *Deleted

## 2019-04-12 NOTE — Telephone Encounter (Signed)
Left message for patient to call office.  

## 2019-04-12 NOTE — Telephone Encounter (Signed)
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 Patient states that she is very asthmatic and is wondering if we can write a letter for her stating that she should self quarantine due to her asthma and her being high risk. Patient would like a return call from the nurse.

## 2019-04-13 NOTE — Telephone Encounter (Signed)
Patient called back and stated that her boss gave her time off since she is high risk. Advised that if she does start showing symptoms to get tested. Patient verbalized understanding.

## 2019-04-13 NOTE — Telephone Encounter (Signed)
Would need more information.   Was she in contact/close quarters with the Electronics engineer.  If the exposure was yesterday as in 9/7 or 9/9 it is likely too early to get tested as likely would not be positive.  However if she does develop symptoms then she should be tested at that time.

## 2019-04-20 ENCOUNTER — Other Ambulatory Visit: Payer: Self-pay

## 2019-04-20 ENCOUNTER — Emergency Department (HOSPITAL_COMMUNITY): Payer: Medicaid Other

## 2019-04-20 ENCOUNTER — Emergency Department (HOSPITAL_COMMUNITY)
Admission: EM | Admit: 2019-04-20 | Discharge: 2019-04-21 | Discharge status: Against Medical Advice | Payer: Medicaid Other | Attending: Emergency Medicine | Admitting: Emergency Medicine

## 2019-04-20 DIAGNOSIS — Z5321 Procedure and treatment not carried out due to patient leaving prior to being seen by health care provider: Secondary | ICD-10-CM | POA: Insufficient documentation

## 2019-04-20 LAB — BASIC METABOLIC PANEL
Anion gap: 7 (ref 5–15)
BUN: 7 mg/dL (ref 6–20)
CO2: 27 mmol/L (ref 22–32)
Calcium: 8.9 mg/dL (ref 8.9–10.3)
Chloride: 104 mmol/L (ref 98–111)
Creatinine, Ser: 0.75 mg/dL (ref 0.44–1.00)
GFR calc Af Amer: >60 mL/min (ref >60)
GFR calc non Af Amer: >60 mL/min (ref >60)
Glucose, Bld: 80 mg/dL (ref 70–99)
Potassium: 4 mmol/L (ref 3.5–5.1)
Sodium: 138 mmol/L (ref 135–145)

## 2019-04-20 LAB — CBC
HCT: 42.5 % (ref 36.0–46.0)
Hemoglobin: 13.7 g/dL (ref 12.0–15.0)
MCH: 30.8 pg (ref 26.0–34.0)
MCHC: 32.2 g/dL (ref 30.0–36.0)
MCV: 95.5 fL (ref 80.0–100.0)
Platelets: 288 10*3/uL (ref 150–400)
RBC: 4.45 MIL/uL (ref 3.87–5.11)
RDW: 12.2 % (ref 11.5–15.5)
WBC: 7.4 10*3/uL (ref 4.0–10.5)
nRBC: 0 % (ref 0.0–0.2)

## 2019-04-20 LAB — TROPONIN I (HIGH SENSITIVITY)
Troponin I (High Sensitivity): <2 ng/L (ref <18)
Troponin I (High Sensitivity): <2 ng/L (ref <18)

## 2019-04-20 LAB — I-STAT BETA HCG BLOOD, ED (MC, WL, AP ONLY): I-stat hCG, quantitative: <5 m[IU]/mL (ref <5)

## 2019-04-20 MED ORDER — SODIUM CHLORIDE 0.9% FLUSH: 3.0000 mL | Freq: Once | INTRAVENOUS | Status: DC

## 2019-04-20 NOTE — ED Triage Notes (Signed)
Pt presents with mid-sternum chest pain, SOB, heart racing and neck pain x2 days.

## 2019-04-21 ENCOUNTER — Encounter: Payer: Self-pay | Admitting: Allergy & Immunology

## 2019-04-21 ENCOUNTER — Ambulatory Visit (INDEPENDENT_AMBULATORY_CARE_PROVIDER_SITE_OTHER): Payer: Self-pay

## 2019-04-21 DIAGNOSIS — J309 Allergic rhinitis, unspecified: Secondary | ICD-10-CM

## 2019-04-21 NOTE — ED Notes (Signed)
Pt called for vitals check with no response and not visible in waiting room

## 2019-04-21 NOTE — ED Notes (Signed)
No answer for vitals recheck x2 

## 2019-04-29 ENCOUNTER — Ambulatory Visit (INDEPENDENT_AMBULATORY_CARE_PROVIDER_SITE_OTHER): Payer: Self-pay

## 2019-04-29 DIAGNOSIS — J309 Allergic rhinitis, unspecified: Secondary | ICD-10-CM

## 2019-05-06 ENCOUNTER — Ambulatory Visit (INDEPENDENT_AMBULATORY_CARE_PROVIDER_SITE_OTHER): Payer: Self-pay | Admitting: *Deleted

## 2019-05-06 DIAGNOSIS — J309 Allergic rhinitis, unspecified: Secondary | ICD-10-CM

## 2019-05-13 ENCOUNTER — Ambulatory Visit (INDEPENDENT_AMBULATORY_CARE_PROVIDER_SITE_OTHER): Payer: Self-pay | Admitting: *Deleted

## 2019-05-13 DIAGNOSIS — J309 Allergic rhinitis, unspecified: Secondary | ICD-10-CM

## 2019-05-19 ENCOUNTER — Ambulatory Visit (INDEPENDENT_AMBULATORY_CARE_PROVIDER_SITE_OTHER): Payer: Self-pay

## 2019-05-19 DIAGNOSIS — J309 Allergic rhinitis, unspecified: Secondary | ICD-10-CM

## 2019-05-27 ENCOUNTER — Ambulatory Visit (INDEPENDENT_AMBULATORY_CARE_PROVIDER_SITE_OTHER): Payer: Self-pay

## 2019-05-27 DIAGNOSIS — J309 Allergic rhinitis, unspecified: Secondary | ICD-10-CM

## 2019-06-03 ENCOUNTER — Ambulatory Visit (INDEPENDENT_AMBULATORY_CARE_PROVIDER_SITE_OTHER): Payer: Self-pay

## 2019-06-03 DIAGNOSIS — J309 Allergic rhinitis, unspecified: Secondary | ICD-10-CM

## 2019-06-09 DIAGNOSIS — J301 Allergic rhinitis due to pollen: Secondary | ICD-10-CM

## 2019-06-17 ENCOUNTER — Ambulatory Visit (INDEPENDENT_AMBULATORY_CARE_PROVIDER_SITE_OTHER): Payer: Self-pay | Admitting: *Deleted

## 2019-06-17 DIAGNOSIS — J309 Allergic rhinitis, unspecified: Secondary | ICD-10-CM

## 2019-07-14 ENCOUNTER — Ambulatory Visit (INDEPENDENT_AMBULATORY_CARE_PROVIDER_SITE_OTHER): Payer: Self-pay

## 2019-07-14 DIAGNOSIS — J309 Allergic rhinitis, unspecified: Secondary | ICD-10-CM

## 2019-07-22 ENCOUNTER — Ambulatory Visit (INDEPENDENT_AMBULATORY_CARE_PROVIDER_SITE_OTHER): Payer: Self-pay | Admitting: *Deleted

## 2019-07-22 DIAGNOSIS — J309 Allergic rhinitis, unspecified: Secondary | ICD-10-CM

## 2019-08-12 ENCOUNTER — Ambulatory Visit (INDEPENDENT_AMBULATORY_CARE_PROVIDER_SITE_OTHER): Payer: Self-pay

## 2019-08-12 DIAGNOSIS — J309 Allergic rhinitis, unspecified: Secondary | ICD-10-CM

## 2019-08-26 ENCOUNTER — Ambulatory Visit (INDEPENDENT_AMBULATORY_CARE_PROVIDER_SITE_OTHER): Payer: Self-pay | Admitting: *Deleted

## 2019-08-26 DIAGNOSIS — J309 Allergic rhinitis, unspecified: Secondary | ICD-10-CM

## 2019-09-09 ENCOUNTER — Ambulatory Visit (INDEPENDENT_AMBULATORY_CARE_PROVIDER_SITE_OTHER): Payer: Self-pay | Admitting: *Deleted

## 2019-09-09 DIAGNOSIS — J309 Allergic rhinitis, unspecified: Secondary | ICD-10-CM

## 2019-09-16 ENCOUNTER — Ambulatory Visit (INDEPENDENT_AMBULATORY_CARE_PROVIDER_SITE_OTHER): Payer: Self-pay | Admitting: *Deleted

## 2019-09-16 DIAGNOSIS — J309 Allergic rhinitis, unspecified: Secondary | ICD-10-CM

## 2019-09-23 ENCOUNTER — Ambulatory Visit (INDEPENDENT_AMBULATORY_CARE_PROVIDER_SITE_OTHER): Payer: Self-pay | Admitting: *Deleted

## 2019-09-23 DIAGNOSIS — J309 Allergic rhinitis, unspecified: Secondary | ICD-10-CM

## 2019-10-07 ENCOUNTER — Ambulatory Visit (INDEPENDENT_AMBULATORY_CARE_PROVIDER_SITE_OTHER): Payer: Self-pay | Admitting: *Deleted

## 2019-10-07 DIAGNOSIS — J309 Allergic rhinitis, unspecified: Secondary | ICD-10-CM

## 2019-10-14 ENCOUNTER — Ambulatory Visit (INDEPENDENT_AMBULATORY_CARE_PROVIDER_SITE_OTHER): Payer: Self-pay

## 2019-10-14 DIAGNOSIS — J309 Allergic rhinitis, unspecified: Secondary | ICD-10-CM

## 2019-10-28 ENCOUNTER — Ambulatory Visit (INDEPENDENT_AMBULATORY_CARE_PROVIDER_SITE_OTHER): Payer: Self-pay

## 2019-10-28 DIAGNOSIS — J309 Allergic rhinitis, unspecified: Secondary | ICD-10-CM

## 2019-11-01 ENCOUNTER — Telehealth: Payer: Self-pay

## 2019-11-01 NOTE — Telephone Encounter (Signed)
Patient called requesting a refill on her inhaler. I informed the patient she needed an office visit due to her not being seen since 11-06-2017. I asked if she would like to go ahead and schedule a follow up visit. She states she hasn't came back to see [place] due to her not having insurance. Patient does still come in for her allergy injections. I asked the patient had she seen her pcp in the last 6 months. Patient stated she has. I recommended she call her PCP to see if they will fill it until she can see [place].

## 2019-12-02 ENCOUNTER — Ambulatory Visit: Payer: Self-pay

## 2019-12-02 DIAGNOSIS — J309 Allergic rhinitis, unspecified: Secondary | ICD-10-CM

## 2019-12-06 ENCOUNTER — Ambulatory Visit (INDEPENDENT_AMBULATORY_CARE_PROVIDER_SITE_OTHER): Payer: Self-pay

## 2019-12-06 DIAGNOSIS — J309 Allergic rhinitis, unspecified: Secondary | ICD-10-CM

## 2020-01-12 ENCOUNTER — Ambulatory Visit (INDEPENDENT_AMBULATORY_CARE_PROVIDER_SITE_OTHER): Payer: Self-pay

## 2020-01-12 DIAGNOSIS — J309 Allergic rhinitis, unspecified: Secondary | ICD-10-CM

## 2020-01-24 ENCOUNTER — Ambulatory Visit (INDEPENDENT_AMBULATORY_CARE_PROVIDER_SITE_OTHER): Payer: Self-pay

## 2020-01-24 DIAGNOSIS — J309 Allergic rhinitis, unspecified: Secondary | ICD-10-CM

## 2020-03-05 ENCOUNTER — Ambulatory Visit (HOSPITAL_COMMUNITY)
Admission: EM | Admit: 2020-03-05 | Discharge: 2020-03-05 | Disposition: A | Discharge status: Home / Self Care | Payer: Medicaid Other | Attending: Family Medicine | Admitting: Family Medicine

## 2020-03-05 ENCOUNTER — Encounter (HOSPITAL_COMMUNITY): Payer: Self-pay | Admitting: Emergency Medicine

## 2020-03-05 ENCOUNTER — Other Ambulatory Visit: Payer: Self-pay

## 2020-03-05 DIAGNOSIS — T148XXA Other injury of unspecified body region, initial encounter: Secondary | ICD-10-CM

## 2020-03-05 DIAGNOSIS — L089 Local infection of the skin and subcutaneous tissue, unspecified: Secondary | ICD-10-CM

## 2020-03-05 MED ORDER — CEPHALEXIN 500 MG PO CAPS: 500.0000 mg | ORAL_CAPSULE | Freq: Four times a day (QID) | ORAL | 0 refills | Status: AC

## 2020-03-05 NOTE — Discharge Instructions (Signed)
Complete course of antibiotics.  Cleanse area daily with soap and water, keep covered to keep clean while active.  Allow area to dry out daily as well.  Return for any worsening, or no improvement in the next week.

## 2020-03-05 NOTE — ED Provider Notes (Signed)
[facility]    CSN: [id number] Arrival date & time: 03/05/20  1751      History   Chief Complaint Chief Complaint  Patient presents with  . Wound Check    HPI Tiffany Huffman is a 19 y.o. female.   Tiffany Huffman presents with complaints of wound to left lateral breast, which she first noted 4 days ago. Itches. Drainage and bleeding. Denies any previous similar. No known injury or trauma to the area. States she can get eczema to the breast sometimes so is uncertain if this was original source. Has applied vaseline which hasn't helped.    ROS per HPI, negative if not otherwise mentioned.      Past Medical History:  Diagnosis Date  . Asthma     Patient Active Problem List   Diagnosis Date Noted  . Moderate persistent asthma, uncomplicated 07/24/2016  . Allergic rhinoconjunctivitis 07/24/2016    History reviewed. No pertinent surgical history.  OB History   No obstetric history on file.      Home Medications    Prior to Admission medications   Medication Sig Start Date End Date Taking? Authorizing Provider  levocetirizine (XYZAL) 5 MG tablet Take 1 tablet (5 mg total) by mouth every evening. 01/20/18  Yes Padgett, Pilar Grammes, MD  albuterol (PROAIR HFA) 108 (90 Base) MCG/ACT inhaler INHALE 2 PUFFS BY MOUTH EVERY 4 TO 6 HOURS AS NEEDED FOR WHEEZING 06/17/18   Marcelyn Bruins, MD  albuterol (PROVENTIL) (2.5 MG/3ML) 0.083% nebulizer solution Use one vial in the nebulizer every 4-6 hours if needed for cough or wheeze 01/20/18   Marcelyn Bruins, MD  budesonide-formoterol [facility]) 160-4.5 MCG/ACT inhaler Inhale 2 puffs into the lungs 2 (two) times daily. 01/20/18   Marcelyn Bruins, MD  cephALEXin (KEFLEX) 500 MG capsule Take 1 capsule (500 mg total) by mouth 4 (four) times daily for 7 days. 03/05/20 03/12/20  Georgetta Haber, NP  cetirizine (ZYRTEC) 10 MG tablet Take 1 tablet (10 mg total) by mouth daily. 07/24/16    Alfonse Spruce, MD  EPINEPHrine (EPIPEN 2-PAK) 0.3 mg/0.3 mL IJ SOAJ injection Use as directed for severe allergic reaction 01/21/18   Marcelyn Bruins, MD  montelukast (SINGULAIR) 10 MG tablet Take 1 tablet (10 mg total) by mouth at bedtime. 01/20/18   Marcelyn Bruins, MD  Olopatadine HCl 0.2 % SOLN Place 1 drop into both eyes daily. 01/20/18   Marcelyn Bruins, MD  polyethylene glycol powder [facility]) powder Take 17 g by mouth. 06/18/16   [provider]    Family History Family History  Problem Relation Age of Onset  . Allergic rhinitis Mother   . Asthma Mother     Social History Social History   Tobacco Use  . Smoking status: Passive Smoke Exposure - Never Smoker  . Smokeless tobacco: Never Used  Substance Use Topics  . Alcohol use: No  . Drug use: No     Allergies   Patient has no known allergies.   Review of Systems Review of Systems   Physical Exam Triage Vital Signs ED Triage Vitals  Enc Vitals Group     BP 03/05/20 1943 129/86     Pulse Rate 03/05/20 1943 (!) 102     Resp 03/05/20 1943 19     Temp 03/05/20 1943 98.1 F (36.7 C)     Temp Source 03/05/20 1943 Oral     SpO2 03/05/20 1943 100 %  Weight --      Height --      Head Circumference --      Peak Flow --      Pain Score 03/05/20 1939 6     Pain Loc --      Pain Edu? --      Excl. in GC? --    No data found.  Updated Vital Signs BP 129/86 (BP Location: Right Arm)   Pulse (!) 102   Temp 98.1 F (36.7 C) (Oral)   Resp 19   LMP 02/20/2020   SpO2 100%   Visual Acuity Right Eye Distance:   Left Eye Distance:   Bilateral Distance:    Right Eye Near:   Left Eye Near:    Bilateral Near:     Physical Exam Constitutional:      General: She is not in acute distress.    Appearance: She is well-developed.  Cardiovascular:     Rate and Rhythm: Normal rate.  Pulmonary:     Effort: Pulmonary effort is normal.  Chest:       Comments:  3 cm open wound to left lateral breast, green drainage noted to bandage, no surrounding redness, no induration  Skin:    General: Skin is warm and dry.  Neurological:     Mental Status: She is alert and oriented to person, place, and time.      UC Treatments / Results  Labs (all labs ordered are listed, but only abnormal results are displayed) Labs Reviewed - No data to display  EKG   Radiology No results found.  Procedures Procedures (including critical care time)  Medications Ordered in UC Medications - No data to display  Initial Impression / Assessment and Plan / UC Course  I have reviewed the triage vital signs and the nursing notes.  Pertinent labs & imaging results that were available during my care of the patient were reviewed by me and considered in my medical decision making (see chart for details).     Open wound to left breast, concern for infection with keflex provided. Wound care discussed. Return precautions provided. Patient verbalized understanding and agreeable to plan.   Final Clinical Impressions(s) / UC Diagnoses   Final diagnoses:  Wound infection     Discharge Instructions     Complete course of antibiotics.  Cleanse area daily with soap and water, keep covered to keep clean while active.  Allow area to dry out daily as well.  Return for any worsening, or no improvement in the next week.    ED Prescriptions    Medication Sig Dispense Auth. Provider   cephALEXin (KEFLEX) 500 MG capsule Take 1 capsule (500 mg total) by mouth 4 (four) times daily for 7 days. 28 capsule Georgetta Haber, NP     PDMP not reviewed this encounter.   Georgetta Haber, NP 03/05/20 [phone]

## 2020-03-05 NOTE — ED Triage Notes (Addendum)
Patient noticed a raw place on left breast about 3-4 days ago.  No history of this prior to now.  No known injury  Drainage is green

## 2020-04-12 ENCOUNTER — Telehealth: Payer: Self-pay

## 2020-04-12 ENCOUNTER — Ambulatory Visit: Payer: Self-pay

## 2020-04-12 NOTE — Telephone Encounter (Signed)
Spoke to patient to see what insurance she currently had. Patient states she does not have any insurance other than family planning medicaid. Patient is getting new insurance through her job on Monday and will call back with insurance information and schedule an office visit then.

## 2020-04-12 NOTE — Telephone Encounter (Signed)
Please see what her current insurance is.     Per our injection policy she does need an office visit to ensure her injections are going well and determine if she needs to continue or not based on where she is in her therapy.  Office visits are once a year minimum for those on allergy shots.

## 2020-04-12 NOTE — Telephone Encounter (Signed)
Patient came in to get her injection and was advise last injection was given 0.2 of green vial on 01/24/2020. Last office visit was on 11/06/2017 and was advise she needed a visit prior to getting her injections. Patient stated she cannot do any visits due to her insurance. Please advise if patient needs to continue injections and what payment method is being utilize.

## 2020-08-23 ENCOUNTER — Ambulatory Visit (INDEPENDENT_AMBULATORY_CARE_PROVIDER_SITE_OTHER): Payer: No Typology Code available for payment source | Admitting: Allergy

## 2020-08-23 ENCOUNTER — Encounter: Payer: Self-pay | Admitting: Allergy

## 2020-08-23 ENCOUNTER — Other Ambulatory Visit: Payer: Self-pay

## 2020-08-23 VITALS — BP 102/62 | HR 62 | Temp 97.9°F | Resp 18 | Ht 65.0 in | Wt 239.2 lb

## 2020-08-23 DIAGNOSIS — H1010 Acute atopic conjunctivitis, unspecified eye: Secondary | ICD-10-CM

## 2020-08-23 DIAGNOSIS — J309 Allergic rhinitis, unspecified: Secondary | ICD-10-CM

## 2020-08-23 DIAGNOSIS — J4540 Moderate persistent asthma, uncomplicated: Secondary | ICD-10-CM | POA: Diagnosis not present

## 2020-08-23 MED ORDER — OLOPATADINE HCL 0.2 % OP SOLN: 1.0000 [drp] | Freq: Every day | OPHTHALMIC | 3 refills | Status: DC

## 2020-08-23 MED ORDER — CETIRIZINE HCL 10 MG PO TABS: 10.0000 mg | ORAL_TABLET | Freq: Every day | ORAL | 5 refills | Status: DC

## 2020-08-23 MED ORDER — AZELASTINE-FLUTICASONE 137-50 MCG/ACT NA SUSP: 1.0000 | Freq: Two times a day (BID) | NASAL | 5 refills | Status: DC

## 2020-08-23 MED ORDER — ALBUTEROL SULFATE (2.5 MG/3ML) 0.083% IN NEBU
INHALATION_SOLUTION | RESPIRATORY_TRACT | 1 refills | Status: DC
Start: 2020-08-23 — End: 2020-12-27

## 2020-08-23 MED ORDER — MONTELUKAST SODIUM 10 MG PO TABS: 10.0000 mg | ORAL_TABLET | Freq: Every day | ORAL | 3 refills | Status: DC

## 2020-08-23 MED ORDER — BREZTRI AEROSPHERE 160-9-4.8 MCG/ACT IN AERO: 2.0000 | INHALATION_SPRAY | Freq: Two times a day (BID) | RESPIRATORY_TRACT | 5 refills | Status: DC

## 2020-08-23 MED ORDER — ALBUTEROL SULFATE HFA 108 (90 BASE) MCG/ACT IN AERS
INHALATION_SPRAY | RESPIRATORY_TRACT | 1 refills | Status: DC
Start: 2020-08-23 — End: 2020-12-27

## 2020-08-23 NOTE — Progress Notes (Signed)
Follow-up Note  RE: Tiffany Huffman MRN: [id number] DOB: February 04, 2001 Date of Office Visit: 08/23/2020   History of present illness: Tiffany Huffman is a 20 y.o. female presenting today for follow-up of asthma, allergic rhinitis with conjunctivitis. She was last seen in the office on 11/06/2017 by myself.  She states she did have COVID at the end of August into September 2021.  She reports symptoms of chills, fever, cough, shortness of breath, headache, loss of appetite.  She did get COVID-vaccine Pfizer first dose October 1 and 2 dose October 22.  She states since having COVID she has had worsening of her asthma symptoms.  She has more cough, shortness of breath and wheeze.  Some nighttime awakenings about once a week.  She is needing to use her albuterol inhaler about twice a week and the neb about once a week.  She is on Symbicort however she ran out of her own supply about 5 months ago and has been using her mom's.  She also is taking Singulair but is using her sisters.  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 Since her last visit she was started on allergen immunotherapy and she states it was working very well for her however she had some back-to-back vacations and ended up missing several months of injections to the point where she would have to basically restart.  Her last injection was over the summer.  She would like to restart on the immunotherapy as it was providing a benefit.  She states the Xyzal is not helpful.  Zyrtec works better.  She is having both runny and stuffy nose.   Review of systems: Review of Systems  Constitutional: Negative.   HENT:       See HPI  Eyes: Negative.   Respiratory:       See HPI  Cardiovascular: Negative.   Gastrointestinal: Negative.    Musculoskeletal: Negative.   Skin: Negative.   Neurological: Negative.     All other systems negative unless noted above in HPI  Past medical/social/surgical/family history have been reviewed and are unchanged unless specifically indicated below.  No changes  Medication List: Current Outpatient Medications  Medication Sig Dispense Refill  . Azelastine-Fluticasone 137-50 MCG/ACT SUSP Place 1 spray into the nose in the morning and at bedtime. 23 g 5  . Budeson-Glycopyrrol-Formoterol (BREZTRI AEROSPHERE) 160-9-4.8 MCG/ACT AERO Inhale 2 puffs into the lungs in the morning and at bedtime. 10.7 g 5  . EPINEPHrine (EPIPEN 2-PAK) 0.3 mg/0.3 mL IJ SOAJ injection Use as directed for severe allergic reaction 2 Device 2  . mometasone (ELOCON) 0.1 % cream mometasone 0.1 % topical cream  APP TOPICALLY TO ITCHY AREA OF BACK ONCE D    . albuterol (PROAIR HFA) 108 (90 Base) MCG/ACT inhaler INHALE 2 PUFFS BY MOUTH EVERY 4 TO 6 HOURS AS NEEDED FOR WHEEZING 8.5 g 1  . albuterol (PROVENTIL) (2.5 MG/3ML) 0.083% nebulizer solution Use one vial in the nebulizer every 4-6 hours if needed for cough or wheeze 180 mL 1  . cetirizine (ZYRTEC) 10 MG tablet Take 1 tablet (10 mg total) by mouth daily. 30 tablet 5  . montelukast (SINGULAIR) 10 MG tablet Take 1 tablet (10 mg total) by mouth at bedtime. 30 tablet 3  .  Olopatadine HCl 0.2 % SOLN Place 1 drop into both eyes daily. 2.5 mL 3  . polyethylene glycol powder (GLYCOLAX/MIRALAX) powder Take 17 g by mouth. (Patient not taking: Reported on 08/23/2020)     No current facility-administered medications for this visit.     Known medication allergies: No Known Allergies   Physical examination: Blood pressure 102/62, pulse 62, temperature 97.9 F (36.6 C), temperature source Temporal, resp. rate 18, height 5\' 5"  (1.651 m), weight 239 lb 3.2 oz (108.5 kg), SpO2 98 %.  General: Alert, interactive, in no acute distress. HEENT: PERRLA,TMs pearly gray, turbinates  minimally edematous without discharge, post-pharynx non erythematous. Neck: Supple without lymphadenopathy. Lungs: Clear to auscultation without wheezing, rhonchi or rales. {no increased work of breathing. CV: Normal S1, S2 without murmurs. Abdomen: Nondistended, nontender. Skin: Warm and dry, without lesions or rashes. Extremities:  No clubbing, cyanosis or edema. Neuro:   Grossly intact.  Diagnositics/Labs: Labs:  Component     Latest Ref Rng & Units 11/06/2017  IgE (Immunoglobulin E), Serum     6 - 495 IU/mL 215  D Pteronyssinus IgE     Class I kU/L 0.32 (A)  D Farinae IgE     Class 0/I kU/L 0.29 (A)  Cat Dander IgE     Class III kU/L 1.76 (A)  Dog Dander IgE     Class III kU/L 3.11 (A)  01/06/2018 Grass IgE     Class I kU/L 0.34 (A)  Timothy Grass IgE     Class 0/I kU/L 0.29 (A)  Johnson Grass IgE     Class I kU/L 0.32 (A)  Cockroach, German IgE     Class 0/I kU/L 0.12 (A)  Penicillium Chrysogen IgE     Class II kU/L 1.08 (A)  Cladosporium Herbarum IgE     Class III kU/L 3.54 (A)  Aspergillus Fumigatus IgE     Class IV kU/L 5.14 (A)  Alternaria Alternata IgE     Class IV kU/L 4.15 (A)  Maple/Box Elder IgE     Class I kU/L 0.34 (A)  Common Silver [place] IgE     Class I kU/L 0.42 (A)  Cedar, Mountain IgE     Class I kU/L 0.33 (A)  Oak, White IgE     Class I kU/L 0.44 (A)  Elm, American IgE     Class II kU/L 1.16 (A)  Cottonwood IgE     Class I kU/L 0.51 (A)  Pecan, Hickory IgE     Class IV kU/L 5.80 (A)  White Mulberry IgE     Class 0 kU/L <0.10  Ragweed, Short IgE     Class II kU/L 0.83 (A)  Pigweed, Rough IgE     Class I kU/L 0.48 (A)  Sheep Sorrel IgE Qn     Class I kU/L 0.44 (A)  Mouse Urine IgE     Class 0 kU/L <0.10    Spirometry: FEV1: 2.63 L 88%, FVC: 3.61 L 106%, ratio consistent with Nonobstructive pattern  Assessment and plan:   Moderate persistent asthma  - at this time asthma control could be improved  - stop Symbicort for now  - start  Breztri 2 puffs twice a day.  This is a step-up in therapy from Symbicort and is a maintenance controller inhaler  - continue montelukast 10 mg one tablet once a day  - ProAir HFA 2 puffs or albuterol 1 vial nebulizer every 4-6 hours as needed for cough/wheeze/shortness of breath/chest tightness.  May use 15-20  minutes prior to activity.   Monitor frequency of use.    Asthma control goals:   Full participation in all desired activities (may need albuterol before activity)  Albuterol use two time or less a week on average (not counting use with activity)  Cough interfering with sleep two time or less a month  Oral steroids no more than once a year  No hospitalizations  Allergic rhinitis with conjunctivitis  - environmental allergy testing is positive to dust mites, cat dander, dog dander, cockroach, grass pollen, tree pollen, weed pollen, molds. Allergen avoidance measures provided  - recommend use of saline rinse to help flush and clean out the nasal cavity  - trial dymista 1 spray each nostril twice a day.  This is a combination nasal spray with Flonase + Astelin (nasal antihistamine).  This helps with both nasal congestion and drainage.   - Zyrtec 1-2 tablets a day  - Pataday one drop each eye once a day for itchy, watery eyes  - she would like to resume allergen immunotherapy. Discussed today would like asthma to be under good control and meeting the above goals.   Once you are meeting those goals then let us know so you can resume  Return to clinic in 3-4 months or earlier if problem  I appreciate the opportunity to take part in Erleen's care. Please do not hesitate to contact me with questions.  Sincerely,   Margo Aye, MD Allergy/Immunology [facility]

## 2020-08-23 NOTE — Patient Instructions (Addendum)
Moderate persistent asthma  - at this time asthma control could be improved  - stop Symbicort for now  - start Breztri 2 puffs twice a day.  This is a step-up in therapy from Symbicort and is a maintenance controller inhaler  - continue montelukast 10 mg one tablet once a day  - ProAir HFA 2 puffs or albuterol 1 vial nebulizer every 4-6 hours as needed for cough/wheeze/shortness of breath/chest tightness.  May use 15-20 minutes prior to activity.   Monitor frequency of use.    Asthma control goals:   Full participation in all desired activities (may need albuterol before activity)  Albuterol use two time or less a week on average (not counting use with activity)  Cough interfering with sleep two time or less a month  Oral steroids no more than once a year  No hospitalizations  Allergic rhinitis with conjunctivitis  - environmental allergy testing is positive to dust mites, cat dander, dog dander, cockroach, grass pollen, tree pollen, weed pollen, molds. Allergen avoidance measures provided  - recommend use of saline rinse to help flush and clean out the nasal cavity  - trial dymista 1 spray each nostril twice a day.  This is a combination nasal spray with Flonase + Astelin (nasal antihistamine).  This helps with both nasal congestion and drainage.   - Zyrtec 1-2 tablets a day  - Pataday one drop each eye once a day for itchy, watery eyes  - she would like to resume allergen immunotherapy. Discussed today would like asthma to be under good control and meeting the above goals.   Once you are meeting those goals then let us know so you can resume  Return to clinic in 3-4 months or earlier if problem

## 2020-08-27 ENCOUNTER — Telehealth: Payer: Self-pay

## 2020-08-27 NOTE — Telephone Encounter (Signed)
Called however was not able to leave a message due to mailbox being full.

## 2020-08-27 NOTE — Telephone Encounter (Signed)
-----   Message from [facility] Larose Hires, MD sent at 08/24/2020 11:18 AM EST ----- Please create the following letter: ----------------------------------------------------------------  To whom it may concern:  Tiffany Huffman is a patient of mine at the [facility] for management of asthma and allergies.  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  This has led in part to her asthma being not well controlled and requiring step-up in her therapy.  It is my understanding that she does not need to wear full personal protective equipment if she is not entering a COVID-positive patient room.  It is my recommendation that she refrain from going into COVID-positive patient rooms so that she does not have any triggering events of her asthma.  She should be able to perform all other duties not requiring full personal protective equipment.  She should have access to her bronchodilator at all times. Feel free to contact my office if any questions.  Sincerely,      Margo Aye, MD [facility]

## 2020-08-27 NOTE — Telephone Encounter (Signed)
Yes she can come pick the letter up Wednesday

## 2020-08-27 NOTE — Telephone Encounter (Signed)
Letter generated, placed in your office to be signed. Do you want [place] to call patient to collect? Please advise on this

## 2020-08-28 NOTE — Telephone Encounter (Signed)
Patient has been informed of the letter and will be by tomorrow to pick it up. Patient in wanting to re-start allergy injections. It looks like her vials expired 06/2020 and will need vials made. Dr. Delorse Lek where would you like to start her at?

## 2020-08-28 NOTE — Telephone Encounter (Signed)
Restart immunotherapy at the beginning.  However before starting I would like to make sure her asthma is under good control.  Advised her at the visit that she needed to let us know within 2 to 3 weeks of being back on her asthma regimen if her symptoms have improved and if she is meeting the goals outlined in her visit summary as below  Asthma control goals:  Full participation in all desired activities (may need albuterol before activity) Albuterol use two time or less a week on average (not counting use with activity) Cough interfering with sleep two time or less a month Oral steroids no more than once a year No hospitalizations .

## 2020-08-29 ENCOUNTER — Telehealth: Payer: Self-pay

## 2020-08-29 NOTE — Telephone Encounter (Signed)
Called patient back to inform her of the note per Dr.Padgett. Patient agreed and said that she would call our office back after she has been on the asthma regimen for 3 weeks and schedule an appointment to resume her allergy injections.

## 2020-08-29 NOTE — Telephone Encounter (Signed)
Called and tried to leave a message for patient to return office call to let her know that her letter from Dr. Delorse Lek is complete. Please inform patient that the latter has been sent to her home.

## 2020-08-29 NOTE — Telephone Encounter (Signed)
Patient called back, informed all nurses were in with patients and they would give her a call back when they finished up with patients.

## 2020-08-29 NOTE — Telephone Encounter (Signed)
Called and tried to leave a message for patient to return office call to let her know that her letter from Dr. Padgett is complete. Please inform patient that the latter has been sent to her home. 

## 2020-08-31 ENCOUNTER — Telehealth: Payer: Self-pay

## 2020-08-31 NOTE — Telephone Encounter (Signed)
PA initiated through [email or web address] and pending approval.

## 2020-09-05 NOTE — Telephone Encounter (Signed)
PA still pending approval.

## 2020-09-12 NOTE — Telephone Encounter (Signed)
PA is still pending through CoverMyMeds 

## 2020-09-21 NOTE — Telephone Encounter (Signed)
PA has been resubmitted for [facility] through [facility] and is currently pending approval/denial.

## 2020-10-03 ENCOUNTER — Other Ambulatory Visit: Payer: Self-pay

## 2020-10-03 ENCOUNTER — Ambulatory Visit (INDEPENDENT_AMBULATORY_CARE_PROVIDER_SITE_OTHER): Payer: No Typology Code available for payment source | Admitting: Plastic Surgery

## 2020-10-03 ENCOUNTER — Encounter: Payer: Self-pay | Admitting: Plastic Surgery

## 2020-10-03 VITALS — BP 114/73 | HR 72 | Ht 65.0 in | Wt 237.0 lb

## 2020-10-03 DIAGNOSIS — M4004 Postural kyphosis, thoracic region: Secondary | ICD-10-CM | POA: Diagnosis not present

## 2020-10-03 DIAGNOSIS — N62 Hypertrophy of breast: Secondary | ICD-10-CM

## 2020-10-03 DIAGNOSIS — M546 Pain in thoracic spine: Secondary | ICD-10-CM

## 2020-10-03 DIAGNOSIS — M545 Low back pain, unspecified: Secondary | ICD-10-CM

## 2020-10-03 NOTE — Progress Notes (Signed)
Referring Provider Maryellen Pile, MD [address] [place],  [place] [address]   CC:  Chief Complaint  Patient presents with  . Advice Only      Tiffany Huffman is an 20 y.o. female.  HPI: Patient presents to discuss breast reduction.  She has had years of back pain, neck pain and shoulder grooving related to her large breasts.  She is tried over-the-counter medications, warm packs, cold packs and supportive bras with little relief.  She is currently a G cup and wants to be about at the.  She also gets rashes beneath her breast that have been refractory to over-the-counter treatments.  She has had no previous breast procedures or biopsies and has no family history of breast cancer.  She does not smoke and is not diabetic  No Known Allergies  Outpatient Encounter Medications as of 10/03/2020  Medication Sig Note  . albuterol (PROAIR HFA) 108 (90 Base) MCG/ACT inhaler INHALE 2 PUFFS BY MOUTH EVERY 4 TO 6 HOURS AS NEEDED FOR WHEEZING   . albuterol (PROVENTIL) (2.5 MG/3ML) 0.083% nebulizer solution Use one vial in the nebulizer every 4-6 hours if needed for cough or wheeze   . Azelastine-Fluticasone 137-50 MCG/ACT SUSP Place 1 spray into the nose in the morning and at bedtime.   . Budeson-Glycopyrrol-Formoterol (BREZTRI AEROSPHERE) 160-9-4.8 MCG/ACT AERO Inhale 2 puffs into the lungs in the morning and at bedtime.   . cetirizine (ZYRTEC) 10 MG tablet Take 1 tablet (10 mg total) by mouth daily.   Marland Kitchen EPINEPHrine (EPIPEN 2-PAK) 0.3 mg/0.3 mL IJ SOAJ injection Use as directed for severe allergic reaction   . mometasone (ELOCON) 0.1 % cream mometasone 0.1 % topical cream  APP TOPICALLY TO ITCHY AREA OF BACK ONCE D   . montelukast (SINGULAIR) 10 MG tablet Take 1 tablet (10 mg total) by mouth at bedtime.   . Olopatadine HCl 0.2 % SOLN Place 1 drop into both eyes daily.   . polyethylene glycol powder (GLYCOLAX/MIRALAX) powder Take 17 g by mouth. 07/24/2016: Received from: [facility] Received Sig: Take 17 g by mouth daily as needed for Constipation. Take daily as needed to maintain soft stools   No facility-administered encounter medications on file as of 10/03/2020.     Past Medical History:  Diagnosis Date  . Asthma     No past surgical history on file.  Family History  Problem Relation Age of Onset  . Allergic rhinitis Mother   . Asthma Mother     Social History   Social History Narrative  . Not on file     Review of Systems General: Denies fevers, chills, weight loss CV: Denies chest pain, shortness of breath, palpitations  Physical Exam Vitals with BMI 10/03/2020 08/23/2020 03/05/2020  Height 5\' 5"  5\' 5"  -  Weight 237 lbs 239 lbs 3 oz -  BMI 39.44 39.8 -  Systolic 114 102  Diastolic 73 62 86  Pulse 72 62 102    General:  No acute distress,  Alert and oriented, Non-Toxic, Normal speech and affect Breast: She has grade 3 ptosis.  Sternal notch to nipple is 40 cm bilaterally.  Nipple to fold is 20 cm bilaterally.  I do not see any obvious scars or masses.  Assessment/Plan The patient has bilateral symptomatic macromastia.  She is a good candidate for a breast reduction.  She is interested in pursuing surgical treatment.  She has tried supportive garments and fitted bras with no relief.  The details of breast reduction surgery were discussed.  I explained the procedure in detail along the with the expected scars.  The risks were discussed in detail and include bleeding, infection, damage to surrounding structures, need for additional procedures, nipple loss, change in nipple sensation, persistent pain, contour irregularities and asymmetries.  I explained that breast feeding is often not possible after breast reduction surgery.  We discussed the expected postoperative course with an overall recovery period of about 1 month.  She demonstrated full understanding of all risks.  We discussed her personal risk factors that include the  length of her breasts.  I explained that it does limit how small I can make her as the pedicle from either direction would still need to be fairly large.  I explained this does put under slightly increased chance of nipple necrosis but after discussing free nipple graft technique she was not interested in that approach.  I anticipate approximately 1000g of tissue removed from each side.   Tiffany Huffman 10/03/2020, 5:06 PM

## 2020-11-15 ENCOUNTER — Ambulatory Visit (INDEPENDENT_AMBULATORY_CARE_PROVIDER_SITE_OTHER): Payer: No Typology Code available for payment source | Admitting: Allergy

## 2020-11-15 ENCOUNTER — Other Ambulatory Visit: Payer: Self-pay

## 2020-11-15 ENCOUNTER — Encounter: Payer: Self-pay | Admitting: Allergy

## 2020-11-15 VITALS — BP 122/78 | HR 56 | Temp 98.0°F

## 2020-11-15 DIAGNOSIS — J4540 Moderate persistent asthma, uncomplicated: Secondary | ICD-10-CM | POA: Diagnosis not present

## 2020-11-15 DIAGNOSIS — J3089 Other allergic rhinitis: Secondary | ICD-10-CM | POA: Diagnosis not present

## 2020-11-15 DIAGNOSIS — H1013 Acute atopic conjunctivitis, bilateral: Secondary | ICD-10-CM | POA: Diagnosis not present

## 2020-11-15 MED ORDER — BEPOTASTINE BESILATE 1.5 % OP SOLN: 1.0000 [drp] | Freq: Two times a day (BID) | OPHTHALMIC | 5 refills | Status: AC | PRN

## 2020-11-15 MED ORDER — FLUTICASONE PROPIONATE 50 MCG/ACT NA SUSP: 2.0000 | Freq: Every day | NASAL | 2 refills | Status: DC

## 2020-11-15 MED ORDER — AZELASTINE HCL 0.15 % NA SOLN: 2.0000 | Freq: Two times a day (BID) | NASAL | 5 refills | Status: DC

## 2020-11-15 NOTE — Patient Instructions (Addendum)
Moderate persistent asthma  - control is much improved  - continue Breztri 2 puffs twice a day   - continue montelukast 10 mg one tablet once a day  - ProAir HFA 2 puffs or albuterol 1 vial nebulizer every 4-6 hours as needed for cough/wheeze/shortness of breath/chest tightness.  May use 15-20 minutes prior to activity.   Monitor frequency of use.    Asthma control goals:   Full participation in all desired activities (may need albuterol before activity)  Albuterol use two time or less a week on average (not counting use with activity)  Cough interfering with sleep two time or less a month  Oral steroids no more than once a year  No hospitalizations  Allergic rhinitis with conjunctivitis  - continue avoidance measures for dust mites, cat dander, dog dander, cockroach, grass pollen, tree pollen, weed pollen, molds  - recommend use of saline rinse to help flush and clean out the nasal cavity  - use Astelin 2 sprays each nostril twice a day as needed for nasal drainage control  - useFlonase 2 sprays each nostril daily for 1-2 weeks at a time before stopping once nasal congestion improves for maximum benefit  - continue Zyrtec 1-2 tablets a day  - use Bepreve 1 drop each eye twice a day as needed for itchy, watery eyes  - she would like to resume allergen immunotherapy.  Asthma is under good control at this time thus we can restart allergen immunotherapy.  Schedule new start appointment.  Will get your vials remade. Bring Epipen device on days of your injections   Return to clinic in 4-6 months or earlier if problem

## 2020-11-15 NOTE — Progress Notes (Signed)
Follow-up Note  RE: Tiffany Huffman MRN: [id number] DOB: [birth year] Date of Office Visit: 11/15/2020   History of present illness: Tiffany Huffman is a 19 y.o. female presenting today for follow-up of asthma and allergic rhinitis with conjunctivitis.  She was last seen in the office on 08/23/2020 by myself.  She states her asthma is doing much better with the use of Breztri.  She uses 2 puffs twice a day.  Her insurance does not cover this at this time but she has continued to use the samples provided to her.  She also continues to take montelukast.  She states with this regimen on board she has only needed to use her albuterol maybe twice a week if that.  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  She has not had any ED or urgent care visits for her asthma or any systemic steroid needs since the last visit. She remains interested in allergy shots.  She states her symptoms are still quite severe with itchy watery eyes, nasal congestion and drainage and sneezing.  She states the Dymista was not covered thus she does not have any nasal sprays to use at this time.  The Pataday eyedrop also was not covered.  She is taking cetirizine daily.      Review of systems: Review of Systems  Constitutional: Negative.   HENT:       See HPI  Eyes:       See HPI  Respiratory: Negative.   Cardiovascular: Negative.   Gastrointestinal: Negative.   Musculoskeletal: Negative.   Skin: Negative.   Neurological: Negative.     All other systems negative unless noted above in HPI  Past medical/social/surgical/family history have been reviewed and are unchanged unless specifically indicated below.  No changes  Medication List: Current Outpatient Medications   Medication Sig Dispense Refill  . albuterol (PROAIR HFA) 108 (90 Base) MCG/ACT inhaler INHALE 2 PUFFS BY MOUTH EVERY 4 TO 6 HOURS AS NEEDED FOR WHEEZING 8.5 g 1  . albuterol (PROVENTIL) (2.5 MG/3ML) 0.083% nebulizer solution Use one vial in the nebulizer every 4-6 hours if needed for cough or wheeze 180 mL 1  . Azelastine HCl 0.15 % SOLN Place 2 sprays into both nostrils 2 (two) times daily. 30 mL 5  . Bepotastine Besilate 1.5 % SOLN Place 1 drop into both eyes 2 (two) times daily as needed. 10 mL 5  . Budeson-Glycopyrrol-Formoterol (BREZTRI AEROSPHERE) 160-9-4.8 MCG/ACT AERO Inhale 2 puffs into the lungs in the morning and at bedtime. 10.7 g 5  . cetirizine (ZYRTEC) 10 MG tablet Take 1 tablet (10 mg total) by mouth daily. 30 tablet 5  . fluticasone (FLONASE) 50 MCG/ACT nasal spray Place 2 sprays into both nostrils daily. 16 g 2  . mometasone (ELOCON) 0.1 % cream mometasone 0.1 % topical cream  APP TOPICALLY TO ITCHY AREA OF BACK ONCE D    . montelukast (SINGULAIR) 10 MG tablet Take 1 tablet (10 mg total) by mouth at bedtime. 30 tablet 3  . polyethylene glycol powder (GLYCOLAX/MIRALAX) powder Take 17 g by mouth.    . EPINEPHrine (EPIPEN 2-PAK) 0.3 mg/0.3 mL IJ SOAJ injection Use as directed for severe allergic reaction (Patient not taking: Reported on 11/15/2020)  2 Device 2  . Olopatadine HCl 0.2 % SOLN Place 1 drop into both eyes daily. (Patient not taking: Reported on 11/15/2020) 2.5 mL 3   No current facility-administered medications for this visit.     Known medication allergies: No Known Allergies   Physical examination: Blood pressure 122/78, pulse (!) 56, temperature 98 F (36.7 C), SpO2 100 %.  General: Alert, interactive, in no acute distress. HEENT: PERRLA, TMs pearly gray, turbinates mildly edematous without discharge, post-pharynx non erythematous. Neck: Supple without lymphadenopathy. Lungs: Clear to auscultation without wheezing, rhonchi or rales. {no increased work of  breathing. CV: Normal S1, S2 without murmurs. Abdomen: Nondistended, nontender. Skin: Warm and dry, without lesions or rashes. Extremities:  No clubbing, cyanosis or edema. Neuro:   Grossly intact.  Diagnositics/Labs:  Spirometry: FEV1: 2.46L 80%, FVC: 3.22L 91%, ratio consistent with nonobstructive pattern   Assessment and plan:   Moderate persistent asthma  - control is much improved  - continue Breztri 2 puffs twice a day   - continue montelukast 10 mg one tablet once a day  - ProAir HFA 2 puffs or albuterol 1 vial nebulizer every 4-6 hours as needed for cough/wheeze/shortness of breath/chest tightness.  May use 15-20 minutes prior to activity.   Monitor frequency of use.    Asthma control goals:   Full participation in all desired activities (may need albuterol before activity)  Albuterol use two time or less a week on average (not counting use with activity)  Cough interfering with sleep two time or less a month  Oral steroids no more than once a year  No hospitalizations  Allergic rhinitis with conjunctivitis  - continue avoidance measures for dust mites, cat dander, dog dander, cockroach, grass pollen, tree pollen, weed pollen, molds  - recommend use of saline rinse to help flush and clean out the nasal cavity  - use Astelin 2 sprays each nostril twice a day as needed for nasal drainage control  - useFlonase 2 sprays each nostril daily for 1-2 weeks at a time before stopping once nasal congestion improves for maximum benefit  - continue Zyrtec 1-2 tablets a day  - use Bepreve 1 drop each eye twice a day as needed for itchy, watery eyes  - she would like to resume allergen immunotherapy.  Asthma is under good control at this time thus we can restart allergen immunotherapy.  Schedule new start appointment.  Will get your vials remade. Bring Epipen device on days of your injections   Return to clinic in 4-6 months or earlier if problem I appreciate the opportunity to  take part in Lunell's care. Please do not hesitate to contact me with questions.  Sincerely,   Margo Aye, MD Allergy/Immunology [facility]

## 2020-11-15 NOTE — Addendum Note (Signed)
Addended by: Robet Leu A on: 11/15/2020 06:47 PM   Modules accepted: Orders

## 2020-11-19 DIAGNOSIS — J3081 Allergic rhinitis due to animal (cat) (dog) hair and dander: Secondary | ICD-10-CM

## 2020-11-19 NOTE — Progress Notes (Addendum)
VIALS EXP 11-19-21.  Restarting & changed label.

## 2020-11-20 DIAGNOSIS — J3089 Other allergic rhinitis: Secondary | ICD-10-CM | POA: Diagnosis not present

## 2020-12-06 ENCOUNTER — Other Ambulatory Visit: Payer: Self-pay

## 2020-12-06 ENCOUNTER — Ambulatory Visit (INDEPENDENT_AMBULATORY_CARE_PROVIDER_SITE_OTHER): Payer: No Typology Code available for payment source | Admitting: *Deleted

## 2020-12-06 DIAGNOSIS — J309 Allergic rhinitis, unspecified: Secondary | ICD-10-CM | POA: Diagnosis not present

## 2020-12-06 MED ORDER — EPINEPHRINE 0.3 MG/0.3ML IJ SOAJ
0.3000 mg | Freq: Once | INTRAMUSCULAR | 1 refills | Status: AC
  Filled 2020-12-06: qty 2, 2d supply, fill #0

## 2020-12-06 NOTE — Progress Notes (Signed)
Immunotherapy   Patient Details  Name: Tiffany Huffman MRN: [id number] Date of Birth: 2000/08/30  12/06/2020  [name] started injections for  Pollen-Pet, Mold-CR-RW-Mite Following schedule: B  Frequency:1 time per week Epi-Pen:Epi-Pen Available  Consent signed and patient instructions given. Patient restarted allergy injections and received 0.61mL of Pollen-Pet in her RUA and 0.64mL of Mold-CR-RW-Mite in her LUA. Patient waited 30 minutes and did not experience any issues.    [name] 12/06/2020, 7:07 PM

## 2020-12-07 ENCOUNTER — Other Ambulatory Visit (HOSPITAL_COMMUNITY): Payer: Self-pay

## 2020-12-14 ENCOUNTER — Ambulatory Visit (INDEPENDENT_AMBULATORY_CARE_PROVIDER_SITE_OTHER): Payer: No Typology Code available for payment source

## 2020-12-14 DIAGNOSIS — J309 Allergic rhinitis, unspecified: Secondary | ICD-10-CM | POA: Diagnosis not present

## 2020-12-17 ENCOUNTER — Other Ambulatory Visit (HOSPITAL_COMMUNITY): Payer: Self-pay

## 2020-12-20 ENCOUNTER — Ambulatory Visit (INDEPENDENT_AMBULATORY_CARE_PROVIDER_SITE_OTHER): Payer: No Typology Code available for payment source

## 2020-12-20 DIAGNOSIS — J309 Allergic rhinitis, unspecified: Secondary | ICD-10-CM

## 2020-12-25 ENCOUNTER — Ambulatory Visit (INDEPENDENT_AMBULATORY_CARE_PROVIDER_SITE_OTHER): Payer: No Typology Code available for payment source | Admitting: Surgical

## 2020-12-25 ENCOUNTER — Encounter: Payer: Self-pay | Admitting: Surgical

## 2020-12-25 ENCOUNTER — Other Ambulatory Visit: Payer: Self-pay

## 2020-12-25 VITALS — BP 136/84 | HR 82 | Ht 65.0 in | Wt 228.4 lb

## 2020-12-25 DIAGNOSIS — M4004 Postural kyphosis, thoracic region: Secondary | ICD-10-CM

## 2020-12-25 DIAGNOSIS — M545 Low back pain, unspecified: Secondary | ICD-10-CM

## 2020-12-25 DIAGNOSIS — Z719 Counseling, unspecified: Secondary | ICD-10-CM

## 2020-12-25 DIAGNOSIS — N62 Hypertrophy of breast: Secondary | ICD-10-CM

## 2020-12-25 DIAGNOSIS — M546 Pain in thoracic spine: Secondary | ICD-10-CM

## 2020-12-25 NOTE — H&P (View-Only) (Signed)
Patient ID: Tiffany Huffman, female    DOB: 11-01-2000, 20 y.o.   MRN: [id number]  Chief Complaint  Patient presents with  . Pre-op Exam      ICD-10-CM   1. Macromastia  N62   2. Back pain of thoracolumbar region  M54.50    M54.6   3. 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  female  with a history of macromastia.  She presents for preoperative evaluation for upcoming procedure, Bilateral Breast Reduction , scheduled for 01/21/2021 with Dr.  Arita Miss  The patient has not had problems with anesthesia. No history of DVT/PE.  No family history of DVT/PE.  No family or personal history of bleeding or clotting disorders.  Patient is not currently taking any blood thinners.  No history of CVA/MI.   Summary of Previous Visit: She is currently a G cup.  No previous breast procedures or biopsies.  No family history of breast cancer. She is not a diabetic. STN is 40 cm bilaterally  Per Dr. Thomos Lemons consult note they discussed free nipple graft versus pedicle technique and patient is not interested in free nipple graft technique.  She is aware that she would be limited on size with the pedicle technique and at an increased risk of nipple necrosis.  **Patient reports today that she smokes marijuana daily.  She reports that she does not not smoke nicotine or tobacco, but she does use tobacco wraps with marijuana.**  Estimated excess breast tissue to be removed at time of surgery: 1000 on the left and 1000 on the right.  PMH Significant for: Asthma, reports this is overall well controlled.  She does report that she notices exercise-induced asthma and occasional shortness of breath that is improved with her inhalers.  She reports no hospitalizations related to her asthma.   Past Medical History: Allergies: No Known Allergies  Current Medications:  Current Outpatient Medications:  .  albuterol (PROAIR HFA) 108 (90 Base)  MCG/ACT inhaler, INHALE 2 PUFFS BY MOUTH EVERY 4 TO 6 HOURS AS NEEDED FOR WHEEZING, Disp: 8.5 g, Rfl: 1 .  albuterol (PROVENTIL) (2.5 MG/3ML) 0.083% nebulizer solution, Use one vial in the nebulizer every 4-6 hours if needed for cough or wheeze, Disp: 180 mL, Rfl: 1 .  Azelastine HCl 0.15 % SOLN, Place 2 sprays into both nostrils 2 (two) times daily., Disp: 30 mL, Rfl: 5 .  Budeson-Glycopyrrol-Formoterol (BREZTRI AEROSPHERE) 160-9-4.8 MCG/ACT AERO, Inhale 2 puffs into the lungs in the morning and at bedtime., Disp: 10.7 g, Rfl: 5 .  cetirizine (ZYRTEC) 10 MG tablet, Take 1 tablet (10 mg total) by mouth daily., Disp: 30 tablet, Rfl: 5 .  EPINEPHrine (EPIPEN 2-PAK) 0.3 mg/0.3 mL IJ SOAJ injection, Use as directed for severe allergic reaction, Disp: 2 Device, Rfl: 2 .  mometasone (ELOCON) 0.1 % cream, mometasone 0.1 % topical cream  APP TOPICALLY TO ITCHY AREA OF BACK ONCE D, Disp: , Rfl:  .  montelukast (SINGULAIR) 10 MG tablet, Take 1 tablet (10 mg total) by mouth at bedtime., Disp: 30 tablet, Rfl: 3 .  Olopatadine HCl 0.2 % SOLN, Place 1 drop into both eyes daily., Disp: 2.5 mL, Rfl: 3 .  polyethylene glycol powder (GLYCOLAX/MIRALAX) powder, Take 17 g by mouth., Disp: , Rfl:  .  fluticasone (FLONASE) 50 MCG/ACT nasal spray, Place 2 sprays into both nostrils daily., Disp: 16 g, Rfl: 2  Past Medical  Problems: Past Medical History:  Diagnosis Date  . Asthma   . Eczema     Past Surgical History: Past Surgical History:  Procedure Laterality Date  . TYMPANOSTOMY TUBE PLACEMENT      Social History: Social History   Socioeconomic History  . Marital status: Single    Spouse name: Not on file  . Number of children: Not on file  . Years of education: Not on file  . Highest education level: Not on file  Occupational History  . Not on file  Tobacco Use  . Smoking status: Passive Smoke Exposure - Never Smoker  . Smokeless tobacco: Never Used  Substance and Sexual Activity  . Alcohol use: No   . Drug use: No  . Sexual activity: Not on file  Other Topics Concern  . Not on file  Social History Narrative  . Not on file   Social Determinants of Health   Financial Resource Strain: Not on file  Food Insecurity: Not on file  Transportation Needs: Not on file  Physical Activity: Not on file  Stress: Not on file  Social Connections: Not on file  Intimate Partner Violence: Not on file    Family History: Family History  Problem Relation Age of Onset  . Allergic rhinitis Mother   . Asthma Mother     Review of Systems: Review of Systems  Constitutional: Negative.   Respiratory: Positive for shortness of breath (Occasional due to asthma).   Cardiovascular: Negative.   Gastrointestinal: Negative.   Neurological: Negative.     Physical Exam: Vital Signs BP 136/84 (BP Location: Left Arm, Patient Position: Sitting, Cuff Size: Normal)   Pulse 82   Ht 5\' 5"  (1.651 m)   Wt 228 lb 6.4 oz (103.6 kg)   SpO2 98%   BMI 38.01 kg/m   Physical Exam Constitutional:      General: Not in acute distress.    Appearance: Normal appearance. Not ill-appearing.  HENT:     Head: Normocephalic and atraumatic.  Eyes:     Pupils: Pupils are equal, round Neck:     Musculoskeletal: Normal range of motion.  Cardiovascular:     Rate and Rhythm: Normal rate    Pulses: Normal pulses.  Pulmonary:     Effort: Pulmonary effort is normal. No respiratory distress.  Abdominal:     General: Abdomen is flat. There is no distension.  Musculoskeletal: Normal range of motion.  Skin:    General: Skin is warm and dry.     Findings: No erythema or rash.  Neurological:     General: No focal deficit present.     Mental Status: Alert and oriented to person, place, and time. Mental status is at baseline.     Motor: No weakness.  Psychiatric:        Mood and Affect: Mood normal.        Behavior: Behavior normal.    Assessment/Plan: The patient is scheduled for bilateral breast reduction with Dr.  .  Risks, benefits, and alternatives of procedure discussed, questions answered and consent obtained.    Smoking Status: Marijuana; Counseling Given?  We discussed increased risk of postoperative complications if patient is a nicotine or tobacco.  I discussed with the patient that we will need a nicotine test 1 week prior to surgery.  This was provided to her today.  Recommend she gets this test completed on 01/11/2021. Last Mammogram: No history; Results: N/A  Caprini Score: 4, moderate; Risk Factors include: Smoking marijuana, BMI greater  than 25, and length of planned surgery. Recommendation for mechanical and pharmacological prophylaxis. Encourage early ambulation.   Pictures obtained:[email or web address]   Post-op Rx sent to pharmacy: Norco, Zofran to be sent once nicotine test has been completed.  Patient was provided with the breast reduction and General Surgical Risk consent document and Pain Medication Agreement prior to their appointment.  They had adequate time to read through the risk consent documents and Pain Medication Agreement. We also discussed them in person together during this preop appointment. All of their questions were answered to their satisfaction.  Recommended calling if they have any further questions.  Risk consent form and Pain Medication Agreement to be scanned into patient's chart.  The risk that can be encountered with breast reduction were discussed and include the following but not limited to these:  Breast asymmetry, fluid accumulation, firmness of the breast, inability to breast feed, loss of nipple or areola, skin loss, decrease or no nipple sensation, fat necrosis of the breast tissue, bleeding, infection, healing delay.  There are risks of anesthesia, changes to skin sensation and injury to nerves or blood vessels.  The muscle can be temporarily or permanently injured.  You may have an allergic reaction to tape, suture, glue, blood products which can result in skin  discoloration, swelling, pain, skin lesions, poor healing.  Any of these can lead to the need for revisonal surgery or stage procedures.  A reduction has potential to interfere with diagnostic procedures.  Nipple or breast piercing can increase risks of infection.  This procedure is best done when the breast is fully developed.  Changes in the breast will continue to occur over time.  Pregnancy can alter the outcomes of previous breast reduction surgery, weight gain and weigh loss can also effect the long term appearance.   We discussed the possibility of amputation/free nipple graft technique due to the length of her STN.  She is understanding of the possibility that we would need to transition from a pedicle technique to a free nipple graft technique intraoperatively.  We discussed the risks associated with free nipple graft breast reductions, including but not limited to failure of the graft, partial loss of the graft, loss of sensation of bilateral nipple areola, complete loss of the nipple areola graft, inability to breast-feed, postoperative wounds, ongoing wound care.  We also discussed the risks associated with the pedicle technique.  We discussed that with the pedicle technique she could develop nipple areolar necrosis which would result in loss of the nipple, this would also result in ongoing wound care and possible changes in the shape of her breast.     Electronically signed by: Kermit Balo Silver Parkey, PA-C 12/25/2020 2:46 PM

## 2020-12-25 NOTE — Progress Notes (Addendum)
Patient ID: Tiffany Huffman, female    DOB: 11-01-2000, 20 y.o.   MRN: [id number]  Chief Complaint  Patient presents with  . Pre-op Exam      ICD-10-CM   1. Macromastia  N62   2. Back pain of thoracolumbar region  M54.50    M54.6   3. 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  female  with a history of macromastia.  She presents for preoperative evaluation for upcoming procedure, Bilateral Breast Reduction , scheduled for 01/21/2021 with Dr.  Arita Miss  The patient has not had problems with anesthesia. No history of DVT/PE.  No family history of DVT/PE.  No family or personal history of bleeding or clotting disorders.  Patient is not currently taking any blood thinners.  No history of CVA/MI.   Summary of Previous Visit: She is currently a G cup.  No previous breast procedures or biopsies.  No family history of breast cancer. She is not a diabetic. STN is 40 cm bilaterally  Per Dr. Thomos Lemons consult note they discussed free nipple graft versus pedicle technique and patient is not interested in free nipple graft technique.  She is aware that she would be limited on size with the pedicle technique and at an increased risk of nipple necrosis.  **Patient reports today that she smokes marijuana daily.  She reports that she does not not smoke nicotine or tobacco, but she does use tobacco wraps with marijuana.**  Estimated excess breast tissue to be removed at time of surgery: 1000 on the left and 1000 on the right.  PMH Significant for: Asthma, reports this is overall well controlled.  She does report that she notices exercise-induced asthma and occasional shortness of breath that is improved with her inhalers.  She reports no hospitalizations related to her asthma.   Past Medical History: Allergies: No Known Allergies  Current Medications:  Current Outpatient Medications:  .  albuterol (PROAIR HFA) 108 (90 Base)  MCG/ACT inhaler, INHALE 2 PUFFS BY MOUTH EVERY 4 TO 6 HOURS AS NEEDED FOR WHEEZING, Disp: 8.5 g, Rfl: 1 .  albuterol (PROVENTIL) (2.5 MG/3ML) 0.083% nebulizer solution, Use one vial in the nebulizer every 4-6 hours if needed for cough or wheeze, Disp: 180 mL, Rfl: 1 .  Azelastine HCl 0.15 % SOLN, Place 2 sprays into both nostrils 2 (two) times daily., Disp: 30 mL, Rfl: 5 .  Budeson-Glycopyrrol-Formoterol (BREZTRI AEROSPHERE) 160-9-4.8 MCG/ACT AERO, Inhale 2 puffs into the lungs in the morning and at bedtime., Disp: 10.7 g, Rfl: 5 .  cetirizine (ZYRTEC) 10 MG tablet, Take 1 tablet (10 mg total) by mouth daily., Disp: 30 tablet, Rfl: 5 .  EPINEPHrine (EPIPEN 2-PAK) 0.3 mg/0.3 mL IJ SOAJ injection, Use as directed for severe allergic reaction, Disp: 2 Device, Rfl: 2 .  mometasone (ELOCON) 0.1 % cream, mometasone 0.1 % topical cream  APP TOPICALLY TO ITCHY AREA OF BACK ONCE D, Disp: , Rfl:  .  montelukast (SINGULAIR) 10 MG tablet, Take 1 tablet (10 mg total) by mouth at bedtime., Disp: 30 tablet, Rfl: 3 .  Olopatadine HCl 0.2 % SOLN, Place 1 drop into both eyes daily., Disp: 2.5 mL, Rfl: 3 .  polyethylene glycol powder (GLYCOLAX/MIRALAX) powder, Take 17 g by mouth., Disp: , Rfl:  .  fluticasone (FLONASE) 50 MCG/ACT nasal spray, Place 2 sprays into both nostrils daily., Disp: 16 g, Rfl: 2  Past Medical  Problems: Past Medical History:  Diagnosis Date  . Asthma   . Eczema     Past Surgical History: Past Surgical History:  Procedure Laterality Date  . TYMPANOSTOMY TUBE PLACEMENT      Social History: Social History   Socioeconomic History  . Marital status: Single    Spouse name: Not on file  . Number of children: Not on file  . Years of education: Not on file  . Highest education level: Not on file  Occupational History  . Not on file  Tobacco Use  . Smoking status: Passive Smoke Exposure - Never Smoker  . Smokeless tobacco: Never Used  Substance and Sexual Activity  . Alcohol use: No   . Drug use: No  . Sexual activity: Not on file  Other Topics Concern  . Not on file  Social History Narrative  . Not on file   Social Determinants of Health   Financial Resource Strain: Not on file  Food Insecurity: Not on file  Transportation Needs: Not on file  Physical Activity: Not on file  Stress: Not on file  Social Connections: Not on file  Intimate Partner Violence: Not on file    Family History: Family History  Problem Relation Age of Onset  . Allergic rhinitis Mother   . Asthma Mother     Review of Systems: Review of Systems  Constitutional: Negative.   Respiratory: Positive for shortness of breath (Occasional due to asthma).   Cardiovascular: Negative.   Gastrointestinal: Negative.   Neurological: Negative.     Physical Exam: Vital Signs BP 136/84 (BP Location: Left Arm, Patient Position: Sitting, Cuff Size: Normal)   Pulse 82   Ht 5\' 5"  (1.651 m)   Wt 228 lb 6.4 oz (103.6 kg)   SpO2 98%   BMI 38.01 kg/m   Physical Exam Constitutional:      General: Not in acute distress.    Appearance: Normal appearance. Not ill-appearing.  HENT:     Head: Normocephalic and atraumatic.  Eyes:     Pupils: Pupils are equal, round Neck:     Musculoskeletal: Normal range of motion.  Cardiovascular:     Rate and Rhythm: Normal rate    Pulses: Normal pulses.  Pulmonary:     Effort: Pulmonary effort is normal. No respiratory distress.  Abdominal:     General: Abdomen is flat. There is no distension.  Musculoskeletal: Normal range of motion.  Skin:    General: Skin is warm and dry.     Findings: No erythema or rash.  Neurological:     General: No focal deficit present.     Mental Status: Alert and oriented to person, place, and time. Mental status is at baseline.     Motor: No weakness.  Psychiatric:        Mood and Affect: Mood normal.        Behavior: Behavior normal.    Assessment/Plan: The patient is scheduled for bilateral breast reduction with Dr.  .  Risks, benefits, and alternatives of procedure discussed, questions answered and consent obtained.    Smoking Status: Marijuana; Counseling Given?  We discussed increased risk of postoperative complications if patient is a nicotine or tobacco.  I discussed with the patient that we will need a nicotine test 1 week prior to surgery.  This was provided to her today.  Recommend she gets this test completed on 01/11/2021. Last Mammogram: No history; Results: N/A  Caprini Score: 4, moderate; Risk Factors include: Smoking marijuana, BMI greater  than 25, and length of planned surgery. Recommendation for mechanical and pharmacological prophylaxis. Encourage early ambulation.   Pictures obtained:@[email or web address]   Post-op Rx sent to pharmacy: Norco, Zofran to be sent once nicotine test has been completed.  Patient was provided with the breast reduction and General Surgical Risk consent document and Pain Medication Agreement prior to their appointment.  They had adequate time to read through the risk consent documents and Pain Medication Agreement. We also discussed them in person together during this preop appointment. All of their questions were answered to their satisfaction.  Recommended calling if they have any further questions.  Risk consent form and Pain Medication Agreement to be scanned into patient's chart.  The risk that can be encountered with breast reduction were discussed and include the following but not limited to these:  Breast asymmetry, fluid accumulation, firmness of the breast, inability to breast feed, loss of nipple or areola, skin loss, decrease or no nipple sensation, fat necrosis of the breast tissue, bleeding, infection, healing delay.  There are risks of anesthesia, changes to skin sensation and injury to nerves or blood vessels.  The muscle can be temporarily or permanently injured.  You may have an allergic reaction to tape, suture, glue, blood products which can result in skin  discoloration, swelling, pain, skin lesions, poor healing.  Any of these can lead to the need for revisonal surgery or stage procedures.  A reduction has potential to interfere with diagnostic procedures.  Nipple or breast piercing can increase risks of infection.  This procedure is best done when the breast is fully developed.  Changes in the breast will continue to occur over time.  Pregnancy can alter the outcomes of previous breast reduction surgery, weight gain and weigh loss can also effect the long term appearance.   We discussed the possibility of amputation/free nipple graft technique due to the length of her STN.  She is understanding of the possibility that we would need to transition from a pedicle technique to a free nipple graft technique intraoperatively.  We discussed the risks associated with free nipple graft breast reductions, including but not limited to failure of the graft, partial loss of the graft, loss of sensation of bilateral nipple areola, complete loss of the nipple areola graft, inability to breast-feed, postoperative wounds, ongoing wound care.  We also discussed the risks associated with the pedicle technique.  We discussed that with the pedicle technique she could develop nipple areolar necrosis which would result in loss of the nipple, this would also result in ongoing wound care and possible changes in the shape of her breast.     Electronically signed by: Kermit Balo Shaunessy Dobratz, PA-C 12/25/2020 2:46 PM

## 2020-12-26 ENCOUNTER — Other Ambulatory Visit (HOSPITAL_COMMUNITY): Payer: Self-pay

## 2020-12-26 ENCOUNTER — Telehealth: Payer: Self-pay | Admitting: Surgical

## 2020-12-26 NOTE — Telephone Encounter (Signed)
Called patient, advised medication will be sent to [facility] pharmacy once her nicotine test comes back normal.

## 2020-12-26 NOTE — Telephone Encounter (Signed)
Please call in medications to [facility].

## 2020-12-27 ENCOUNTER — Other Ambulatory Visit (HOSPITAL_COMMUNITY): Payer: Self-pay

## 2020-12-27 ENCOUNTER — Telehealth: Payer: Self-pay | Admitting: Allergy

## 2020-12-27 DIAGNOSIS — H1010 Acute atopic conjunctivitis, unspecified eye: Secondary | ICD-10-CM

## 2020-12-27 DIAGNOSIS — J309 Allergic rhinitis, unspecified: Secondary | ICD-10-CM

## 2020-12-27 DIAGNOSIS — J4540 Moderate persistent asthma, uncomplicated: Secondary | ICD-10-CM

## 2020-12-27 MED ORDER — OLOPATADINE HCL 0.2 % OP SOLN
1.0000 [drp] | Freq: Every day | OPHTHALMIC | 3 refills | Status: AC
  Filled 2020-12-27: qty 2.5, 50d supply, fill #0

## 2020-12-27 MED ORDER — ALBUTEROL SULFATE HFA 108 (90 BASE) MCG/ACT IN AERS
2.0000 | INHALATION_SPRAY | RESPIRATORY_TRACT | 1 refills | Status: DC | PRN
  Filled 2020-12-27: qty 8.5, 17d supply, fill #0
  Filled 2021-07-04: qty 8.5, 17d supply, fill #1

## 2020-12-27 MED ORDER — ALBUTEROL SULFATE (2.5 MG/3ML) 0.083% IN NEBU
3.0000 mL | INHALATION_SOLUTION | RESPIRATORY_TRACT | 1 refills | Status: AC | PRN
  Filled 2020-12-27: qty 150, 9d supply, fill #0

## 2020-12-27 MED ORDER — MONTELUKAST SODIUM 10 MG PO TABS
10.0000 mg | ORAL_TABLET | Freq: Every day | ORAL | 3 refills | Status: DC
  Filled 2020-12-27: qty 30, 30d supply, fill #0
  Filled 2021-10-29: qty 30, 30d supply, fill #1

## 2020-12-27 MED ORDER — EPINEPHRINE 0.3 MG/0.3ML IJ SOAJ
INTRAMUSCULAR | 2 refills | Status: DC
  Filled 2020-12-27: qty 2, 2d supply, fill #0

## 2020-12-27 MED ORDER — BREZTRI AEROSPHERE 160-9-4.8 MCG/ACT IN AERO
2.0000 | INHALATION_SPRAY | Freq: Two times a day (BID) | RESPIRATORY_TRACT | 5 refills | Status: DC
  Filled 2020-12-27: qty 10.7, 30d supply, fill #0

## 2020-12-27 MED ORDER — FLUTICASONE PROPIONATE 50 MCG/ACT NA SUSP
2.0000 | Freq: Every day | NASAL | 2 refills | Status: DC
  Filled 2020-12-27: qty 16, 30d supply, fill #0
  Filled 2021-10-29: qty 16, 30d supply, fill #1

## 2020-12-27 MED ORDER — AZELASTINE HCL 0.15 % NA SOLN
2.0000 | Freq: Two times a day (BID) | NASAL | 5 refills | Status: DC
  Filled 2020-12-27: qty 30, 25d supply, fill #0

## 2020-12-27 MED ORDER — CETIRIZINE HCL 10 MG PO TABS
10.0000 mg | ORAL_TABLET | Freq: Every day | ORAL | 5 refills | Status: DC
  Filled 2020-12-27 – 2021-07-04 (×2): qty 30, 30d supply, fill #0

## 2020-12-27 NOTE — Telephone Encounter (Signed)
Patient states she went to pharmacy to pick up medication but was told since she is a [employer] employee she needed to switch to the [facility] pharmacy.   Patient is requesting all medications going foward to be sent to the [facility].   Patient is requesting refills for Proair inhaler, cetirizine and her epipen. [facility]) Patient is requesting these to be sent in today as she will be going out of town this weekend and is in need of her medication.   Best contact number: [phone]  Please advise.

## 2020-12-27 NOTE — Telephone Encounter (Signed)
Called and tried to leave a message for patient but her mailbox was full. I wanted to inform patient that I moved her medications and sent in refills to [facility] and should be ready for pick up no later than tomorrow.

## 2020-12-28 ENCOUNTER — Ambulatory Visit (INDEPENDENT_AMBULATORY_CARE_PROVIDER_SITE_OTHER): Payer: No Typology Code available for payment source

## 2020-12-28 ENCOUNTER — Other Ambulatory Visit (HOSPITAL_COMMUNITY): Payer: Self-pay

## 2020-12-28 DIAGNOSIS — J309 Allergic rhinitis, unspecified: Secondary | ICD-10-CM

## 2021-01-09 ENCOUNTER — Ambulatory Visit (INDEPENDENT_AMBULATORY_CARE_PROVIDER_SITE_OTHER): Payer: No Typology Code available for payment source

## 2021-01-09 DIAGNOSIS — J309 Allergic rhinitis, unspecified: Secondary | ICD-10-CM | POA: Diagnosis not present

## 2021-01-15 ENCOUNTER — Other Ambulatory Visit: Payer: Self-pay

## 2021-01-15 ENCOUNTER — Telehealth: Payer: Self-pay | Admitting: Plastic Surgery

## 2021-01-15 ENCOUNTER — Encounter (HOSPITAL_BASED_OUTPATIENT_CLINIC_OR_DEPARTMENT_OTHER): Payer: Self-pay | Admitting: Plastic Surgery

## 2021-01-15 DIAGNOSIS — R519 Headache, unspecified: Secondary | ICD-10-CM

## 2021-01-15 DIAGNOSIS — S025XXA Fracture of tooth (traumatic), initial encounter for closed fracture: Secondary | ICD-10-CM

## 2021-01-15 DIAGNOSIS — L309 Dermatitis, unspecified: Secondary | ICD-10-CM

## 2021-01-15 DIAGNOSIS — Z973 Presence of spectacles and contact lenses: Secondary | ICD-10-CM

## 2021-01-15 DIAGNOSIS — G8929 Other chronic pain: Secondary | ICD-10-CM

## 2021-01-15 HISTORY — DX: Dermatitis, unspecified: L30.9

## 2021-01-15 HISTORY — DX: Presence of spectacles and contact lenses: Z97.3

## 2021-01-15 HISTORY — DX: Fracture of tooth (traumatic), initial encounter for closed fracture: S02.5XXA

## 2021-01-15 HISTORY — DX: Headache, unspecified: R51.9

## 2021-01-15 HISTORY — DX: Other chronic pain: G89.29

## 2021-01-15 NOTE — Telephone Encounter (Signed)
Patient is wanting to verify that her labs were received. Please call to advise. Thanks.

## 2021-01-15 NOTE — Progress Notes (Signed)
Spoke w/ via phone for pre-op interview---pt Lab needs dos----    urine poct           Lab results------none COVID test -----patient states asymptomatic no test needed Arrive at -------730 am 01-21-2021 NPO after MN NO Solid Food.  Clear liquids from MN until---630 am then npo Med rec completed Medications to take morning of surgery -----albuterol inhaler and breztri inhalers prn/bring inhaler with you, albuterol nebulizer, azelastine nasal spray, certrizine, flonase nasal spray, eye drop Diabetic medication -----n/a Patient instructed no nail polish to be worn day of surgery Patient instructed to bring photo id and insurance card day of surgery Patient aware to have Driver (ride ) / caregiver   Tiffany Huffman mother will stay  for 24 hours after surgery  Patient Special Instructions -----pt to remove multiple body piericng before surgery and place plastic guards in Pre-Op special Istructions -----none Patient verbalized understanding of instructions that were given at this phone interview. Patient denies shortness of breath, chest pain, fever, cough at this phone interview.

## 2021-01-15 NOTE — Telephone Encounter (Addendum)
Returned patients call. Advised I called [facility] to check on the status for the nicotine test results. It was received on 01/11/21. Was told it could be anytime from 01/17/21-01/21/21, hopefully by this Thurs. Will call [facility] back on the 16th to check the status. Will keep patient informed.

## 2021-01-18 ENCOUNTER — Other Ambulatory Visit (HOSPITAL_COMMUNITY): Payer: Self-pay

## 2021-01-18 ENCOUNTER — Other Ambulatory Visit: Payer: Self-pay | Admitting: Surgical

## 2021-01-18 ENCOUNTER — Other Ambulatory Visit (HOSPITAL_COMMUNITY): Payer: No Typology Code available for payment source

## 2021-01-18 LAB — NICOTINE/COTININE METABOLITES
Cotinine: <1 ng/mL
Nicotine: <1 ng/mL

## 2021-01-18 MED ORDER — ONDANSETRON HCL 4 MG PO TABS
4.0000 mg | ORAL_TABLET | Freq: Three times a day (TID) | ORAL | 0 refills | Status: DC | PRN
  Filled 2021-01-18: qty 20, 7d supply, fill #0

## 2021-01-18 MED ORDER — HYDROCODONE-ACETAMINOPHEN 5-325 MG PO TABS
1.0000 | ORAL_TABLET | Freq: Four times a day (QID) | ORAL | 0 refills | Status: AC | PRN
  Filled 2021-01-18: qty 20, 5d supply, fill #0

## 2021-01-18 NOTE — Progress Notes (Signed)
Post op pain meds Nicotine test negative

## 2021-01-20 NOTE — Anesthesia Preprocedure Evaluation (Addendum)
Anesthesia Evaluation  Patient identified by MRN, date of birth, ID band Patient awake    Reviewed: Allergy & Precautions, NPO status , Patient's Chart, lab work & pertinent test results  Airway Mallampati: II  TM Distance: >3 FB Neck ROM: Full    Dental no notable dental hx. (+) Teeth Intact, Dental Advisory Given   Pulmonary asthma ,    Pulmonary exam normal breath sounds clear to auscultation       Cardiovascular Exercise Tolerance: Good Normal cardiovascular exam Rhythm:Regular Rate:Normal     Neuro/Psych  Headaches, negative psych ROS   GI/Hepatic negative GI ROS, Neg liver ROS,   Endo/Other  negative endocrine ROS  Renal/GU negative Renal ROS     Musculoskeletal negative musculoskeletal ROS (+)   Abdominal (+) + obese,   Peds  Hematology   Anesthesia Other Findings   Reproductive/Obstetrics                            Anesthesia Physical Anesthesia Plan  ASA: 2  Anesthesia Plan: General   Post-op Pain Management:    Induction: Intravenous  PONV Risk Score and Plan: Treatment may vary due to age or medical condition, Ondansetron, Midazolam, Dexamethasone and Scopolamine patch - Pre-op  Airway Management Planned: Oral ETT  Additional Equipment: None  Intra-op Plan:   Post-operative Plan: Extubation in OR  Informed Consent: I have reviewed the patients History and Physical, chart, labs and discussed the procedure including the risks, benefits and alternatives for the proposed anesthesia with the patient or authorized representative who has indicated his/her understanding and acceptance.     Dental advisory given  Plan Discussed with: CRNA and Anesthesiologist  Anesthesia Plan Comments:        Anesthesia Quick Evaluation

## 2021-01-21 ENCOUNTER — Ambulatory Visit (HOSPITAL_BASED_OUTPATIENT_CLINIC_OR_DEPARTMENT_OTHER): Payer: No Typology Code available for payment source | Admitting: Anesthesiology

## 2021-01-21 ENCOUNTER — Ambulatory Visit (HOSPITAL_COMMUNITY)
Admission: RE | Admit: 2021-01-21 | Discharge: 2021-01-21 | Disposition: A | Discharge status: Home / Self Care | Payer: No Typology Code available for payment source | Attending: Plastic Surgery | Admitting: Plastic Surgery

## 2021-01-21 ENCOUNTER — Other Ambulatory Visit: Payer: Self-pay

## 2021-01-21 ENCOUNTER — Encounter (HOSPITAL_BASED_OUTPATIENT_CLINIC_OR_DEPARTMENT_OTHER)
Admission: RE | Disposition: A | Discharge status: Home / Self Care | Payer: Self-pay | Source: Home / Self Care | Attending: Plastic Surgery

## 2021-01-21 ENCOUNTER — Encounter (HOSPITAL_BASED_OUTPATIENT_CLINIC_OR_DEPARTMENT_OTHER): Payer: Self-pay | Admitting: Plastic Surgery

## 2021-01-21 DIAGNOSIS — M546 Pain in thoracic spine: Secondary | ICD-10-CM

## 2021-01-21 DIAGNOSIS — Z79899 Other long term (current) drug therapy: Secondary | ICD-10-CM | POA: Insufficient documentation

## 2021-01-21 DIAGNOSIS — N62 Hypertrophy of breast: Secondary | ICD-10-CM | POA: Insufficient documentation

## 2021-01-21 DIAGNOSIS — M545 Low back pain, unspecified: Secondary | ICD-10-CM

## 2021-01-21 DIAGNOSIS — J45909 Unspecified asthma, uncomplicated: Secondary | ICD-10-CM | POA: Diagnosis not present

## 2021-01-21 DIAGNOSIS — M4004 Postural kyphosis, thoracic region: Secondary | ICD-10-CM

## 2021-01-21 HISTORY — PX: BREAST REDUCTION SURGERY: SHX8

## 2021-01-21 HISTORY — DX: COVID-19: U07.1

## 2021-01-21 LAB — POCT PREGNANCY, URINE: Preg Test, Ur: NEGATIVE

## 2021-01-21 SURGERY — MAMMOPLASTY, REDUCTION
Anesthesia: General | Site: Breast | Laterality: Bilateral

## 2021-01-21 MED ORDER — SCOPOLAMINE 1 MG/3DAYS TD PT72
MEDICATED_PATCH | TRANSDERMAL | Status: AC
  Filled 2021-01-21: qty 1

## 2021-01-21 MED ORDER — LIDOCAINE HCL (CARDIAC) PF 100 MG/5ML IV SOSY
PREFILLED_SYRINGE | INTRAVENOUS | Status: DC | PRN
  Administered 2021-01-21: 100 mg via INTRAVENOUS

## 2021-01-21 MED ORDER — ONDANSETRON HCL 4 MG/2ML IJ SOLN
INTRAMUSCULAR | Status: DC | PRN
  Administered 2021-01-21: 4 mg via INTRAVENOUS

## 2021-01-21 MED ORDER — PROPOFOL 10 MG/ML IV BOLUS
INTRAVENOUS | Status: DC | PRN
  Administered 2021-01-21: 200 mg via INTRAVENOUS

## 2021-01-21 MED ORDER — HYDROMORPHONE HCL 1 MG/ML IJ SOLN
INTRAMUSCULAR | Status: AC
  Filled 2021-01-21: qty 1

## 2021-01-21 MED ORDER — CEFAZOLIN SODIUM-DEXTROSE 2-4 GM/100ML-% IV SOLN
INTRAVENOUS | Status: AC
  Filled 2021-01-21: qty 100

## 2021-01-21 MED ORDER — ACETAMINOPHEN 500 MG PO TABS
1000.0000 mg | ORAL_TABLET | Freq: Once | ORAL | Status: AC
  Administered 2021-01-21: 1000 mg via ORAL

## 2021-01-21 MED ORDER — ONDANSETRON HCL 4 MG/2ML IJ SOLN
INTRAMUSCULAR | Status: AC
  Filled 2021-01-21: qty 2

## 2021-01-21 MED ORDER — FENTANYL CITRATE (PF) 100 MCG/2ML IJ SOLN
INTRAMUSCULAR | Status: DC | PRN
  Administered 2021-01-21: 50 ug via INTRAVENOUS
  Administered 2021-01-21: 100 ug via INTRAVENOUS
  Administered 2021-01-21 (×2): 50 ug via INTRAVENOUS

## 2021-01-21 MED ORDER — LACTATED RINGERS IV SOLN: INTRAVENOUS | Status: DC | PRN

## 2021-01-21 MED ORDER — FENTANYL CITRATE (PF) 250 MCG/5ML IJ SOLN
INTRAMUSCULAR | Status: AC
  Filled 2021-01-21: qty 5

## 2021-01-21 MED ORDER — TRANEXAMIC ACID-NACL 1000-0.7 MG/100ML-% IV SOLN
INTRAVENOUS | Status: AC
  Filled 2021-01-21: qty 100

## 2021-01-21 MED ORDER — DEXAMETHASONE SODIUM PHOSPHATE 4 MG/ML IJ SOLN
INTRAMUSCULAR | Status: DC | PRN
  Administered 2021-01-21: 10 mg via INTRAVENOUS

## 2021-01-21 MED ORDER — ONDANSETRON HCL 4 MG/2ML IJ SOLN: 4.0000 mg | Freq: Once | INTRAMUSCULAR | Status: DC | PRN

## 2021-01-21 MED ORDER — CEFAZOLIN SODIUM-DEXTROSE 2-4 GM/100ML-% IV SOLN
2.0000 g | INTRAVENOUS | Status: AC
  Administered 2021-01-21: 2 g via INTRAVENOUS

## 2021-01-21 MED ORDER — OXYCODONE HCL 5 MG/5ML PO SOLN: 5.0000 mg | Freq: Once | ORAL | Status: AC | PRN

## 2021-01-21 MED ORDER — PROPOFOL 10 MG/ML IV BOLUS
INTRAVENOUS | Status: AC
  Filled 2021-01-21: qty 20

## 2021-01-21 MED ORDER — BUPIVACAINE HCL (PF) 0.25 % IJ SOLN
INTRAMUSCULAR | Status: AC
  Filled 2021-01-21: qty 60

## 2021-01-21 MED ORDER — ROCURONIUM BROMIDE 100 MG/10ML IV SOLN
INTRAVENOUS | Status: DC | PRN
  Administered 2021-01-21: 70 mg via INTRAVENOUS

## 2021-01-21 MED ORDER — LACTATED RINGERS IV SOLN: INTRAVENOUS | Status: DC

## 2021-01-21 MED ORDER — HYDROMORPHONE HCL 1 MG/ML IJ SOLN
0.2500 mg | INTRAMUSCULAR | Status: DC | PRN
  Administered 2021-01-21 (×2): 0.5 mg via INTRAVENOUS

## 2021-01-21 MED ORDER — 0.9 % SODIUM CHLORIDE (POUR BTL) OPTIME
TOPICAL | Status: DC | PRN
  Administered 2021-01-21: 1000 mL

## 2021-01-21 MED ORDER — ACETAMINOPHEN 10 MG/ML IV SOLN: 1000.0000 mg | Freq: Once | INTRAVENOUS | Status: DC | PRN

## 2021-01-21 MED ORDER — OXYCODONE HCL 5 MG PO TABS
ORAL_TABLET | ORAL | Status: AC
  Filled 2021-01-21: qty 1

## 2021-01-21 MED ORDER — SCOPOLAMINE 1 MG/3DAYS TD PT72
1.0000 | MEDICATED_PATCH | TRANSDERMAL | Status: DC
  Administered 2021-01-21: 1.5 mg via TRANSDERMAL

## 2021-01-21 MED ORDER — EPINEPHRINE PF 1 MG/ML IJ SOLN
INTRAMUSCULAR | Status: AC
  Filled 2021-01-21: qty 2

## 2021-01-21 MED ORDER — TRANEXAMIC ACID-NACL 1000-0.7 MG/100ML-% IV SOLN
1000.0000 mg | INTRAVENOUS | Status: AC
  Administered 2021-01-21: 1000 mg via INTRAVENOUS

## 2021-01-21 MED ORDER — FENTANYL CITRATE (PF) 100 MCG/2ML IJ SOLN
INTRAMUSCULAR | Status: AC
  Filled 2021-01-21: qty 2

## 2021-01-21 MED ORDER — DEXAMETHASONE SODIUM PHOSPHATE 10 MG/ML IJ SOLN
INTRAMUSCULAR | Status: AC
  Filled 2021-01-21: qty 1

## 2021-01-21 MED ORDER — OXYCODONE HCL 5 MG PO TABS
5.0000 mg | ORAL_TABLET | Freq: Once | ORAL | Status: AC | PRN
  Administered 2021-01-21: 5 mg via ORAL

## 2021-01-21 MED ORDER — MIDAZOLAM HCL 2 MG/2ML IJ SOLN
INTRAMUSCULAR | Status: AC
  Filled 2021-01-21: qty 2

## 2021-01-21 MED ORDER — ACETAMINOPHEN 500 MG PO TABS
ORAL_TABLET | ORAL | Status: AC
  Filled 2021-01-21: qty 2

## 2021-01-21 MED ORDER — SUGAMMADEX SODIUM 200 MG/2ML IV SOLN
INTRAVENOUS | Status: DC | PRN
  Administered 2021-01-21: 200 mg via INTRAVENOUS

## 2021-01-21 MED ORDER — AMISULPRIDE (ANTIEMETIC) 5 MG/2ML IV SOLN
10.0000 mg | Freq: Once | INTRAVENOUS | Status: DC | PRN
Start: 2021-01-21 — End: 2021-01-21

## 2021-01-21 SURGICAL SUPPLY — 67 items
APL PRP STRL LF DISP 70% ISPRP (MISCELLANEOUS) ×2
APL SKNCLS STERI-STRIP NONHPOA (GAUZE/BANDAGES/DRESSINGS) ×2
BAG DECANTER FOR FLEXI CONT (MISCELLANEOUS) IMPLANT
BENZOIN TINCTURE PRP APPL 2/3 (GAUZE/BANDAGES/DRESSINGS) ×4 IMPLANT
BLADE SURG 10 STRL SS (BLADE) ×2 IMPLANT
BLADE SURG 15 STRL LF DISP TIS (BLADE) ×1 IMPLANT
BLADE SURG 15 STRL SS (BLADE) ×2
BNDG CMPR MED 15X6 ELC VLCR LF (GAUZE/BANDAGES/DRESSINGS) ×1
BNDG ELASTIC 6X15 VLCR STRL LF (GAUZE/BANDAGES/DRESSINGS) ×2 IMPLANT
CANISTER SUCT 1200ML W/VALVE (MISCELLANEOUS) ×2 IMPLANT
CHLORAPREP W/TINT 26 (MISCELLANEOUS) ×4 IMPLANT
CLIP VESOCCLUDE MED 6/CT (CLIP) IMPLANT
COVER BACK TABLE 60X90IN (DRAPES) ×2 IMPLANT
COVER MAYO STAND STRL (DRAPES) ×2 IMPLANT
COVER WAND RF STERILE (DRAPES) IMPLANT
DRAIN CHANNEL 15F RND FF W/TCR (WOUND CARE) IMPLANT
DRAIN PENROSE 0.5X18 (DRAIN) IMPLANT
DRAPE LAPAROSCOPIC ABDOMINAL (DRAPES) ×2 IMPLANT
DRAPE UTILITY XL STRL (DRAPES) ×2 IMPLANT
DRSG PAD ABDOMINAL 8X10 ST (GAUZE/BANDAGES/DRESSINGS) ×6 IMPLANT
ELECT REM PT RETURN 9FT ADLT (ELECTROSURGICAL) ×2
ELECTRODE REM PT RTRN 9FT ADLT (ELECTROSURGICAL) ×1 IMPLANT
EVACUATOR SILICONE 100CC (DRAIN) IMPLANT
GAUZE SPONGE 4X4 12PLY STRL (GAUZE/BANDAGES/DRESSINGS) ×4 IMPLANT
GAUZE XEROFORM 5X9 LF (GAUZE/BANDAGES/DRESSINGS) IMPLANT
GLOVE SRG 8 PF TXTR STRL LF DI (GLOVE) ×1 IMPLANT
GLOVE SURG ENC MOIS LTX SZ6.5 (GLOVE) ×2 IMPLANT
GLOVE SURG ENC TEXT LTX SZ7.5 (GLOVE) ×2 IMPLANT
GLOVE SURG UNDER POLY LF SZ8 (GLOVE) ×2
GOWN STRL REUS W/ TWL LRG LVL3 (GOWN DISPOSABLE) ×2 IMPLANT
GOWN STRL REUS W/TWL LRG LVL3 (GOWN DISPOSABLE) ×4
KIT TURNOVER CYSTO (KITS) ×2 IMPLANT
MARKER SKIN DUAL TIP RULER LAB (MISCELLANEOUS) IMPLANT
NDL SAFETY ECLIPSE 18X1.5 (NEEDLE) ×1 IMPLANT
NEEDLE FILTER BLUNT 18X 1/2SAF (NEEDLE) ×1
NEEDLE FILTER BLUNT 18X1 1/2 (NEEDLE) ×1 IMPLANT
NEEDLE HYPO 18GX1.5 SHARP (NEEDLE) ×2
NEEDLE HYPO 25X1 1.5 SAFETY (NEEDLE) IMPLANT
NEEDLE SPNL 18GX3.5 QUINCKE PK (NEEDLE) ×2 IMPLANT
NS IRRIG 1000ML POUR BTL (IV SOLUTION) ×2 IMPLANT
PACK BASIN DAY SURGERY FS (CUSTOM PROCEDURE TRAY) ×2 IMPLANT
PENCIL SMOKE EVACUATOR (MISCELLANEOUS) ×2 IMPLANT
PIN SAFETY STERILE (MISCELLANEOUS) IMPLANT
SHEET MEDIUM DRAPE 40X70 STRL (DRAPES) ×4 IMPLANT
SLEEVE SCD COMPRESS KNEE MED (STOCKING) ×2 IMPLANT
SPONGE LAP 18X18 RF (DISPOSABLE) ×8 IMPLANT
STAPLER INSORB 30 2030 C-SECTI (MISCELLANEOUS) ×4 IMPLANT
STAPLER VISISTAT 35W (STAPLE) ×2 IMPLANT
STRIP SUTURE WOUND CLOSURE 1/2 (MISCELLANEOUS) ×6 IMPLANT
SUT CHROMIC 4 0 PS 2 18 (SUTURE) IMPLANT
SUT ETHILON 2 0 FS 18 (SUTURE) IMPLANT
SUT ETHILON 3 0 PS 1 (SUTURE) IMPLANT
SUT MNCRL AB 4-0 PS2 18 (SUTURE) ×6 IMPLANT
SUT PDS 3-0 CT2 (SUTURE) ×4
SUT PDS II 3-0 CT2 27 ABS (SUTURE) ×2 IMPLANT
SUT VIC AB 3-0 PS1 18 (SUTURE)
SUT VIC AB 3-0 PS1 18XBRD (SUTURE) IMPLANT
SUT VLOC 180 P-14 24 (SUTURE) ×4 IMPLANT
SYR 50ML LL SCALE MARK (SYRINGE) IMPLANT
SYR BULB IRRIG 60ML STRL (SYRINGE) ×2 IMPLANT
SYR CONTROL 10ML LL (SYRINGE) IMPLANT
TAPE MEASURE VINYL STERILE (MISCELLANEOUS) IMPLANT
TOWEL GREEN STERILE FF (TOWEL DISPOSABLE) ×2 IMPLANT
TUBE CONNECTING 12X1/4 (SUCTIONS) ×2 IMPLANT
TUBING INFILTRATION IT-10001 (TUBING) ×2 IMPLANT
UNDERPAD 30X36 HEAVY ABSORB (UNDERPADS AND DIAPERS) ×4 IMPLANT
YANKAUER SUCT BULB TIP NO VENT (SUCTIONS) ×2 IMPLANT

## 2021-01-21 NOTE — Anesthesia Procedure Notes (Addendum)
Procedure Name: Intubation Date/Time: 01/21/2021 9:41 AM Performed by: Maryella Shivers, CRNA Pre-anesthesia Checklist: Patient identified, Emergency Drugs available, Suction available and Patient being monitored Patient Re-evaluated:Patient Re-evaluated prior to induction Oxygen Delivery Method: Circle system utilized Preoxygenation: Pre-oxygenation with 100% oxygen Induction Type: IV induction Ventilation: Mask ventilation without difficulty Laryngoscope Size: Mac and 3 Grade View: Grade I Tube type: Oral Tube size: 7.0 mm Number of attempts: 1 Airway Equipment and Method: Stylet and Oral airway Placement Confirmation: ETT inserted through vocal cords under direct vision, positive ETCO2 and breath sounds checked- equal and bilateral Secured at: 20 cm Tube secured with: Tape Dental Injury: Teeth and Oropharynx as per pre-operative assessment

## 2021-01-21 NOTE — Anesthesia Postprocedure Evaluation (Signed)
Anesthesia Post Note  Patient: Tiffany Huffman  Procedure(s) Performed: MAMMARY REDUCTION  (BREAST) (Bilateral: Breast)     Patient location during evaluation: PACU Anesthesia Type: General Level of consciousness: awake and alert Pain management: pain level controlled Vital Signs Assessment: post-procedure vital signs reviewed and stable Respiratory status: spontaneous breathing, nonlabored ventilation, respiratory function stable and patient connected to nasal cannula oxygen Cardiovascular status: blood pressure returned to baseline and stable Postop Assessment: no apparent nausea or vomiting Anesthetic complications: no   No notable events documented.  Last Vitals:  Vitals:   01/21/21 1230 01/21/21 1315  BP: 136/90 127/85  Pulse: 63 78  Resp: 18 16  Temp:  36.7 C  SpO2: 99% 99%    Last Pain:  Vitals:   01/21/21 1315  TempSrc:   PainSc: 6                  Trevor Iha

## 2021-01-21 NOTE — Op Note (Signed)
Operative Note   DATE OF OPERATION: 01/21/2021  LOCATION: [facility]   SURGICAL DEPARTMENT: Plastic Surgery  PREOPERATIVE DIAGNOSES: Bilateral symptomatic macromastia.  POSTOPERATIVE DIAGNOSES:  same  PROCEDURE: Bilateral breast reduction with superomedial pedicle.  SURGEON: Ancil Linsey, MD  ASSISTANT: Enedina Finner, RNFA The advanced practice practitioner (APP) assisted throughout the case.  The APP was essential in retraction and counter traction when needed to make the case progress smoothly.  This retraction and assistance made it possible to see the tissue plans for the procedure.  The assistance was needed for blood control, tissue re-approximation and assisted with closure of the incision site.  ANESTHESIA: General.  COMPLICATIONS: None.   INDICATIONS FOR PROCEDURE:  The patient, Tiffany Huffman is a 20 y.o. female born on 21-Aug-2000, is here for treatment of bilateral symptomatic macromastia. MRN: [id number]  CONSENT:  Informed consent was obtained directly from the patient. Risks, benefits and alternatives were fully discussed. Specific risks including but not limited to bleeding, infection, hematoma, seroma, scarring, pain, infection, contracture, asymmetry, wound healing problems, and need for further surgery were all discussed. The patient did have an ample opportunity to have questions answered to satisfaction.   DESCRIPTION OF PROCEDURE:  The patient was marked preoperatively for a Wise pattern skin excision.  The patient was taken to the operating room. SCDs were placed and antibiotics were given. General anesthesia was administered.The patient's operative site was prepped and draped in a sterile fashion. A time out was performed and all information was confirmed to be correct.  Right Breast: The breast was infiltrated with tumescent solution to help with hemostasis.  The nipple was marked with a cookie cutter.  A superomedial pedicle was drawn  out with the base of at least 8 cm in size.  A breast tourniquet was then applied and the pedicle was de-epithelialized.  Breast tourniquet was then let down and all incisions were made with a 10 blade.  The pedicle was then isolated down to the chest wall with cautery and the excision was performed removing tissue primarily inferiorly and laterally.  Hemostasis was obtained and the wound was stapled closed.  Left breast:  The breast was infiltrated with tumescent solution to help with hemostasis.  The nipple was marked with a cookie cutter.  A superomedial pedicle was drawn out with the base of at least 8 cm in size.  A breast tourniquet was then applied and the pedicle was de-epithelialized.  Breast tourniquet was then let down and all incisions were made with a 10 blade.  The pedicle was then isolated down to the chest wall with cautery and the excision was performed removing tissue primarily inferiorly and laterally.  Hemostasis was obtained and the wound was stapled closed.  Patient was then set up to check for size and symmetry.  Minor modifications were made.  This resulted in a total of 1094g removed from the right side and 1000g removed from the left side.  The inframammary incision was closed with a combination of buried in-sorb staples and a running 3-0 Quill suture.  The vertical and periareolar limbs were closed with interrupted buried 4-0 Monocryl and a running 4-0 Quill suture.  Steri-Strips were then applied along with a soft dressing and Ace wrap.  The patient tolerated the procedure well.  There were no complications. The patient was allowed to wake from anesthesia, extubated and taken to the recovery room in satisfactory condition.  I was present for the entire procedure.

## 2021-01-21 NOTE — Transfer of Care (Signed)
Immediate Anesthesia Transfer of Care Note  Patient: Tiffany Huffman  Procedure(s) Performed: MAMMARY REDUCTION  (BREAST) (Bilateral: Breast)  Patient Location: PACU  Anesthesia Type:General  Level of Consciousness: sedated  Airway & Oxygen Therapy: Patient Spontanous Breathing and Patient connected to face mask oxygen  Post-op Assessment: Report given to RN and Post -op Vital signs reviewed and stable  Post vital signs: Reviewed and stable  Last Vitals:  Vitals Value Taken Time  BP 148/77 01/21/21 1137  Temp    Pulse 74 01/21/21 1140  Resp 10 01/21/21 1140  SpO2 98 % 01/21/21 1140  Vitals shown include unvalidated device data.  Last Pain:  Vitals:   01/21/21 0752  TempSrc: Oral  PainSc: 0-No pain      Patients Stated Pain Goal: 3 (01/21/21 0752)  Complications: No notable events documented.

## 2021-01-21 NOTE — Brief Op Note (Signed)
01/21/2021  11:26 AM  PATIENT:  Breanda O Selmon  20 y.o. female  PRE-OPERATIVE DIAGNOSIS:  macromastia  POST-OPERATIVE DIAGNOSIS:  macromastia  PROCEDURE:  Procedure(s) with comments: MAMMARY REDUCTION  (BREAST) (Bilateral) - 2 hours  SURGEON:  Surgeon(s) and Role:    * Allyiah Gartner, Wendy Poet, MD - Primary  PHYSICIAN ASSISTANT: Enedina Finner, RNFA  ASSISTANTS: none   ANESTHESIA:   general  EBL:  50 mL   BLOOD ADMINISTERED:none  DRAINS: none   LOCAL MEDICATIONS USED:  MARCAINE     SPECIMEN:  Source of Specimen:  r and l breast tissue  DISPOSITION OF SPECIMEN:  PATHOLOGY  COUNTS:  YES  TOURNIQUET:  * No tourniquets in log *  DICTATION: .Dragon Dictation  PLAN OF CARE: Discharge to home after PACU  PATIENT DISPOSITION:  PACU - hemodynamically stable.   Delay start of Pharmacological VTE agent (>24hrs) due to surgical blood loss or risk of bleeding: not applicable

## 2021-01-21 NOTE — Interval H&P Note (Signed)
History and Physical Interval Note:  01/21/2021 9:10 AM  Tiffany Huffman  has presented today for surgery, with the diagnosis of macromastia.  The various methods of treatment have been discussed with the patient and family. After consideration of risks, benefits and other options for treatment, the patient has consented to  Procedure(s) with comments: MAMMARY REDUCTION  (BREAST) (Bilateral) - 2 hours as a surgical intervention.  The patient's history has been reviewed, patient examined, no change in status, stable for surgery.  I have reviewed the patient's chart and labs.  Questions were answered to the patient's satisfaction.     Allena Napoleon

## 2021-01-21 NOTE — Discharge Instructions (Addendum)
Activity As tolerated: NO showers for 3 days No heavy activities  Diet: Regular  Wound Care: Keep dressing clean & dry for 3 days.  Keep wrap applied with compression as much as possible.    Do not change dressings for 3 days unless soiled.  Can change if needed but make sure to reapply wrap. After three days can remove wrap and shower.  Then reapply dressings if needed and continue compression with wrap or soft sports bra. Call doctor if any unusual problems occur such as pain, excessive bleeding, unrelieved nausea/vomiting, fever &/or chills  Follow-up appointment: Scheduled for next week.  Post Anesthesia Home Care Instructions  Activity: Get plenty of rest for the remainder of the day. A responsible individual must stay with you for 24 hours following the procedure.  For the next 24 hours, DO NOT: -Drive a car -Advertising copywriter -Drink alcoholic beverages -Take any medication unless instructed by your physician -Make any legal decisions or sign important papers.  Meals: Start with liquid foods such as gelatin or soup. Progress to regular foods as tolerated. Avoid greasy, spicy, heavy foods. If nausea and/or vomiting occur, drink only clear liquids until the nausea and/or vomiting subsides. Call your physician if vomiting continues.  Special Instructions/Symptoms: Your throat may feel dry or sore from the anesthesia or the breathing tube placed in your throat during surgery. If this causes discomfort, gargle with warm salt water. The discomfort should disappear within 24 hours.  If you had a scopolamine patch placed behind your ear for the management of post- operative nausea and/or vomiting:  1. The medication in the patch is effective for 72 hours, after which it should be removed.  Wrap patch in a tissue and discard in the trash. Wash hands thoroughly with soap and water. 2. You may remove the patch earlier than 72 hours if you experience unpleasant side effects which may  include dry mouth, dizziness or visual disturbances. 3. Avoid touching the patch. Wash your hands with soap and water after contact with the patch.      Next dose of Tylenol may be given at 2:30p

## 2021-01-23 ENCOUNTER — Encounter (HOSPITAL_BASED_OUTPATIENT_CLINIC_OR_DEPARTMENT_OTHER): Payer: Self-pay | Admitting: Plastic Surgery

## 2021-01-23 LAB — SURGICAL PATHOLOGY

## 2021-01-25 ENCOUNTER — Ambulatory Visit (INDEPENDENT_AMBULATORY_CARE_PROVIDER_SITE_OTHER): Payer: No Typology Code available for payment source

## 2021-01-25 DIAGNOSIS — J309 Allergic rhinitis, unspecified: Secondary | ICD-10-CM

## 2021-01-30 ENCOUNTER — Ambulatory Visit (INDEPENDENT_AMBULATORY_CARE_PROVIDER_SITE_OTHER): Payer: No Typology Code available for payment source

## 2021-01-30 ENCOUNTER — Encounter: Payer: No Typology Code available for payment source | Admitting: Plastic Surgery

## 2021-01-30 ENCOUNTER — Ambulatory Visit (INDEPENDENT_AMBULATORY_CARE_PROVIDER_SITE_OTHER): Payer: No Typology Code available for payment source | Admitting: Plastic Surgery

## 2021-01-30 ENCOUNTER — Other Ambulatory Visit: Payer: Self-pay

## 2021-01-30 DIAGNOSIS — N62 Hypertrophy of breast: Secondary | ICD-10-CM

## 2021-01-30 DIAGNOSIS — J309 Allergic rhinitis, unspecified: Secondary | ICD-10-CM | POA: Diagnosis not present

## 2021-01-30 NOTE — Progress Notes (Signed)
Patient presents about 1 week out from bilateral breast reduction.  She feels good and is happy.  On exam everything looks to be healing fine with intact incisions and viable nipple areolar complexes.  There is no subcutaneous fluid.  I have asked her to continue compressive garments and avoid strenuous activity.  We will see her again in a few weeks.  

## 2021-02-06 ENCOUNTER — Ambulatory Visit (INDEPENDENT_AMBULATORY_CARE_PROVIDER_SITE_OTHER): Payer: No Typology Code available for payment source

## 2021-02-06 DIAGNOSIS — J309 Allergic rhinitis, unspecified: Secondary | ICD-10-CM

## 2021-02-08 ENCOUNTER — Other Ambulatory Visit (HOSPITAL_COMMUNITY): Payer: Self-pay

## 2021-02-08 MED ORDER — IBUPROFEN 600 MG PO TABS
600.0000 mg | ORAL_TABLET | Freq: Three times a day (TID) | ORAL | 1 refills | Status: DC
  Filled 2021-02-08: qty 30, 10d supply, fill #0
  Filled 2021-10-29: qty 30, 10d supply, fill #1

## 2021-02-08 MED ORDER — LEVONORGESTREL-ETHINYL ESTRAD 0.1-20 MG-MCG PO TABS
1.0000 | ORAL_TABLET | Freq: Every day | ORAL | 0 refills | Status: DC
  Filled 2021-02-08: qty 84, 84d supply, fill #0

## 2021-02-12 ENCOUNTER — Ambulatory Visit (INDEPENDENT_AMBULATORY_CARE_PROVIDER_SITE_OTHER): Payer: No Typology Code available for payment source | Admitting: *Deleted

## 2021-02-12 DIAGNOSIS — J309 Allergic rhinitis, unspecified: Secondary | ICD-10-CM | POA: Diagnosis not present

## 2021-02-13 ENCOUNTER — Encounter: Payer: No Typology Code available for payment source | Admitting: Plastic Surgery

## 2021-02-15 ENCOUNTER — Telehealth: Payer: Self-pay | Admitting: Plastic Surgery

## 2021-02-15 NOTE — Telephone Encounter (Signed)
Per patient, the bottom of her right breast is hard and is leaking yellow liquid. Sx 6/20. Please call to advise [phone]. Thanks.

## 2021-02-18 ENCOUNTER — Encounter (HOSPITAL_COMMUNITY): Payer: Self-pay | Admitting: Emergency Medicine

## 2021-02-18 ENCOUNTER — Emergency Department (HOSPITAL_COMMUNITY)
Admission: EM | Admit: 2021-02-18 | Discharge: 2021-02-19 | Disposition: A | Discharge status: Home / Self Care | Payer: No Typology Code available for payment source | Attending: Emergency Medicine | Admitting: Emergency Medicine

## 2021-02-18 ENCOUNTER — Other Ambulatory Visit: Payer: Self-pay

## 2021-02-18 DIAGNOSIS — L02415 Cutaneous abscess of right lower limb: Secondary | ICD-10-CM | POA: Insufficient documentation

## 2021-02-18 DIAGNOSIS — Z5321 Procedure and treatment not carried out due to patient leaving prior to being seen by health care provider: Secondary | ICD-10-CM | POA: Insufficient documentation

## 2021-02-18 DIAGNOSIS — K611 Rectal abscess: Secondary | ICD-10-CM | POA: Insufficient documentation

## 2021-02-18 DIAGNOSIS — L02214 Cutaneous abscess of groin: Secondary | ICD-10-CM | POA: Diagnosis not present

## 2021-02-18 DIAGNOSIS — Z8616 Personal history of COVID-19: Secondary | ICD-10-CM | POA: Insufficient documentation

## 2021-02-18 DIAGNOSIS — R000 Tachycardia, unspecified: Secondary | ICD-10-CM | POA: Insufficient documentation

## 2021-02-18 DIAGNOSIS — J45909 Unspecified asthma, uncomplicated: Secondary | ICD-10-CM | POA: Insufficient documentation

## 2021-02-18 LAB — CBC WITH DIFFERENTIAL/PLATELET
Abs Immature Granulocytes: 0.02 10*3/uL (ref 0.00–0.07)
Basophils Absolute: 0 10*3/uL (ref 0.0–0.1)
Basophils Relative: 0 %
Eosinophils Absolute: 0.2 10*3/uL (ref 0.0–0.5)
Eosinophils Relative: 2 %
HCT: 37.2 % (ref 36.0–46.0)
Hemoglobin: 12.4 g/dL (ref 12.0–15.0)
Immature Granulocytes: 0 %
Lymphocytes Relative: 19 %
Lymphs Abs: 1.8 10*3/uL (ref 0.7–4.0)
MCH: 30.7 pg (ref 26.0–34.0)
MCHC: 33.3 g/dL (ref 30.0–36.0)
MCV: 92.1 fL (ref 80.0–100.0)
Monocytes Absolute: 0.7 10*3/uL (ref 0.1–1.0)
Monocytes Relative: 8 %
Neutro Abs: 6.7 10*3/uL (ref 1.7–7.7)
Neutrophils Relative %: 71 %
Platelets: 228 10*3/uL (ref 150–400)
RBC: 4.04 MIL/uL (ref 3.87–5.11)
RDW: 12.3 % (ref 11.5–15.5)
WBC: 9.4 10*3/uL (ref 4.0–10.5)
nRBC: 0 % (ref 0.0–0.2)

## 2021-02-18 LAB — BASIC METABOLIC PANEL
Anion gap: 13 (ref 5–15)
BUN: 9 mg/dL (ref 6–20)
CO2: 18 mmol/L — ABNORMAL LOW (ref 22–32)
Calcium: 8.8 mg/dL — ABNORMAL LOW (ref 8.9–10.3)
Chloride: 104 mmol/L (ref 98–111)
Creatinine, Ser: 0.72 mg/dL (ref 0.44–1.00)
GFR, Estimated: >60 mL/min (ref >60)
Glucose, Bld: 100 mg/dL — ABNORMAL HIGH (ref 70–99)
Potassium: 3.7 mmol/L (ref 3.5–5.1)
Sodium: 135 mmol/L (ref 135–145)

## 2021-02-18 LAB — LACTIC ACID, PLASMA: Lactic Acid, Venous: 1.4 mmol/L (ref 0.5–1.9)

## 2021-02-18 MED ORDER — OXYCODONE-ACETAMINOPHEN 5-325 MG PO TABS: 2.0000 | ORAL_TABLET | Freq: Once | ORAL | Status: DC

## 2021-02-18 NOTE — Telephone Encounter (Signed)
Called and spoke with the patient on (02/15/21) regarding the message below.  Asked the patient if she's having fever,chills,nausea, or vomiting.  Patient stated no.   Informed the patient that I spoke with [facility] and he stated that its possible fluid collection that's starting to drain.  He would like for her to use vaseline and gauze, but if she notices any redness to call the office and speak to the doctor on call.  Patient verbalized understanding and agreed.//AB/CMA

## 2021-02-18 NOTE — ED Provider Notes (Signed)
Emergency Medicine Provider Triage Evaluation Note  Tiffany Huffman , a 20 y.o. female  was evaluated in triage.  Pt complains of 1 week of right groin/thigh abscess.  Patient reports warm compresses but it has not opened spontaneously.  She reports and sitting make the pain significantly worse.  She reports intermittent history of abscesses but none recently.  Has not had any antibiotics.  Review of Systems  Positive: Abscess  Negative: Fever, chills   Physical Exam  BP (!) 128/97 (BP Location: Right Arm)   Pulse (!) 115   Temp 99.4 F (37.4 C) (Oral)   Resp 17   LMP 02/11/2021   SpO2 98%   Gen:   Awake, no distress   Resp:  Normal effort  MSK:   Moves extremities without difficulty  Other:  Large area of induration to the right groin and upper thigh with exquisite tenderness to palpation and increased warmth.  Medical Decision Making  Medically screening exam initiated at 10:48 PM.  Appropriate orders placed.  Tiffany Huffman was informed that the remainder of the evaluation will be completed by another provider, this initial triage assessment does not replace that evaluation, and the importance of remaining in the ED until their evaluation is complete.  Large abscess to the right groin and thigh.  Labs pending.  Afebrile but tachycardic.  Pain medication given.   Tiffany Huffman, Tiffany Huffman 02/18/21 2251    Blane Ohara, MD 02/19/21 0004

## 2021-02-18 NOTE — ED Triage Notes (Signed)
Patient reports infected cyst  with swelling at right upper inner thigh onset last week.

## 2021-02-19 ENCOUNTER — Emergency Department (HOSPITAL_BASED_OUTPATIENT_CLINIC_OR_DEPARTMENT_OTHER): Payer: No Typology Code available for payment source

## 2021-02-19 ENCOUNTER — Other Ambulatory Visit: Payer: Self-pay

## 2021-02-19 ENCOUNTER — Other Ambulatory Visit (HOSPITAL_BASED_OUTPATIENT_CLINIC_OR_DEPARTMENT_OTHER): Payer: Self-pay

## 2021-02-19 ENCOUNTER — Emergency Department (HOSPITAL_BASED_OUTPATIENT_CLINIC_OR_DEPARTMENT_OTHER)
Admission: EM | Admit: 2021-02-19 | Discharge: 2021-02-19 | Disposition: A | Discharge status: Home / Self Care | Payer: No Typology Code available for payment source | Source: Home / Self Care | Attending: Emergency Medicine | Admitting: Emergency Medicine

## 2021-02-19 ENCOUNTER — Encounter (HOSPITAL_BASED_OUTPATIENT_CLINIC_OR_DEPARTMENT_OTHER): Payer: Self-pay | Admitting: *Deleted

## 2021-02-19 DIAGNOSIS — L0291 Cutaneous abscess, unspecified: Secondary | ICD-10-CM

## 2021-02-19 DIAGNOSIS — L02415 Cutaneous abscess of right lower limb: Secondary | ICD-10-CM | POA: Insufficient documentation

## 2021-02-19 DIAGNOSIS — Z8616 Personal history of COVID-19: Secondary | ICD-10-CM | POA: Insufficient documentation

## 2021-02-19 DIAGNOSIS — K611 Rectal abscess: Secondary | ICD-10-CM | POA: Insufficient documentation

## 2021-02-19 DIAGNOSIS — J45909 Unspecified asthma, uncomplicated: Secondary | ICD-10-CM | POA: Insufficient documentation

## 2021-02-19 LAB — I-STAT BETA HCG BLOOD, ED (MC, WL, AP ONLY): I-stat hCG, quantitative: <5 m[IU]/mL (ref <5)

## 2021-02-19 MED ORDER — HYDROCODONE-ACETAMINOPHEN 5-325 MG PO TABS
1.0000 | ORAL_TABLET | Freq: Four times a day (QID) | ORAL | 0 refills | Status: DC | PRN
  Filled 2021-02-19: qty 10, 3d supply, fill #0

## 2021-02-19 MED ORDER — IOHEXOL 300 MG/ML  SOLN
100.0000 mL | Freq: Once | INTRAMUSCULAR | Status: AC | PRN
  Administered 2021-02-19: 100 mL via INTRAVENOUS

## 2021-02-19 MED ORDER — FENTANYL CITRATE (PF) 100 MCG/2ML IJ SOLN
50.0000 ug | Freq: Once | INTRAMUSCULAR | Status: AC
  Administered 2021-02-19: 50 ug via INTRAVENOUS
  Filled 2021-02-19: qty 2

## 2021-02-19 MED ORDER — CLINDAMYCIN PHOSPHATE 600 MG/50ML IV SOLN
600.0000 mg | Freq: Once | INTRAVENOUS | Status: AC
  Administered 2021-02-19: 600 mg via INTRAVENOUS
  Filled 2021-02-19: qty 50

## 2021-02-19 MED ORDER — LIDOCAINE HCL (PF) 1 % IJ SOLN
10.0000 mL | Freq: Once | INTRAMUSCULAR | Status: AC
  Administered 2021-02-19: 10 mL
  Filled 2021-02-19: qty 10

## 2021-02-19 MED ORDER — DOXYCYCLINE HYCLATE 100 MG PO CAPS
100.0000 mg | ORAL_CAPSULE | Freq: Two times a day (BID) | ORAL | 0 refills | Status: DC
  Filled 2021-02-19: qty 20, 10d supply, fill #0

## 2021-02-19 NOTE — ED Provider Notes (Signed)
[facility] EMERGENCY DEPT Provider Note   CSN: [id number] Arrival date & time: 02/19/21  1027     History Chief Complaint  Patient presents with   Abscess    Tiffany Huffman is a 20 y.o. female.  Patient is a 20 year old female who presents with an abscess to her right thigh.  She says it started a week ago and is gradually been getting worse.  She denies any known fevers.  She has had some nausea but no vomiting.  She has a history of a prior abscess on her vaginal but no other history of known abscesses or MRSA.  She was seen last night at [facility] ED and waited 12 hours for room.  She came here for further evaluation.  She says it seems to have gotten worse since she was waiting last night.  Her tetanus shot is up-to-date.      Past Medical History:  Diagnosis Date   Asthma    Chipped tooth 01/15/2021   back lower tooth x 1 per pt   Chronic back pain 01/15/2021   due to large breasts per pt   COVID    over 3 months ago per pt on 01-15-2021, asymptomatic   Eczema 01/15/2021   Headache 01/15/2021   occ migraines per pt   Wears contact lenses 01/15/2021   Wears glasses 01/15/2021    Patient Active Problem List   Diagnosis Date Noted   Moderate persistent asthma, uncomplicated 07/24/2016   Allergic rhinoconjunctivitis 07/24/2016    Past Surgical History:  Procedure Laterality Date   BREAST REDUCTION SURGERY Bilateral 01/21/2021   Procedure: MAMMARY REDUCTION  (BREAST);  Surgeon: Allena Napoleon, MD;  Location: [facility];  Service: Plastics;  Laterality: Bilateral;  2 hours   TYMPANOSTOMY TUBE PLACEMENT     as child   WISDOM TOOTH EXTRACTION  2017     OB History   No obstetric history on file.     Family History  Problem Relation Age of Onset   Allergic rhinitis Mother    Asthma Mother     Social History   Tobacco Use   Smoking status: Never    Passive exposure: Yes   Smokeless tobacco: Never  Vaping Use    Vaping Use: Never used  Substance Use Topics   Alcohol use: Yes    Comment: occ   Drug use: Yes    Types: Marijuana    Comment: last used May 24th    Home Medications Prior to Admission medications   Medication Sig Start Date End Date Taking? Authorizing Provider  albuterol (PROAIR HFA) 108 (90 Base) MCG/ACT inhaler Inhale 2 puffs into the lungs every 4 to 6 hours as needed for wheezing. 12/27/20  Yes Padgett, Pilar Grammes, MD  albuterol (PROVENTIL) (2.5 MG/3ML) 0.083% nebulizer solution Take 3 mLs by nebulization every 4 to 6  hours as needed for cough or wheeze. 12/27/20  Yes Padgett, Pilar Grammes, MD  Azelastine HCl 0.15 % SOLN Place 2 sprays into both nostrils 2 (two) times daily. 12/27/20  Yes Padgett, Pilar Grammes, MD  Budeson-Glycopyrrol-Formoterol (BREZTRI AEROSPHERE) 160-9-4.8 MCG/ACT AERO Inhale 2 puffs into the lungs in the morning and at bedtime. 12/27/20  Yes Padgett, Pilar Grammes, MD  cetirizine (ZYRTEC) 10 MG tablet Take 1 tablet (10 mg total) by mouth daily. 12/27/20  Yes Padgett, Pilar Grammes, MD  doxycycline (VIBRAMYCIN) 100 MG capsule Take 1 capsule (100 mg total) by mouth 2 (two) times daily. 02/19/21  Yes Denver Bentson,  Shawna OrleansMelanie, MD  fluticasone (FLONASE) 50 MCG/ACT nasal spray Place 2 sprays into both nostrils daily. 12/27/20  Yes Padgett, Pilar GrammesShaylar Patricia, MD  HYDROcodone-acetaminophen (NORCO/VICODIN) 5-325 MG tablet Take 1 tablet by mouth every 6 (six) hours as needed. 02/19/21  Yes Rolan BuccoBelfi, Zarek Relph, MD  ibuprofen (ADVIL) 600 MG tablet Take 1 tablet (600 mg total) by mouth 3 (three) times daily with food or milk as needed 02/08/21  Yes   montelukast (SINGULAIR) 10 MG tablet Take 1 tablet (10 mg total) by mouth at bedtime. 12/27/20  Yes Padgett, Pilar GrammesShaylar Patricia, MD  Olopatadine HCl 0.2 % SOLN Place 1 drop into both eyes daily. 12/27/20  Yes Padgett, Pilar GrammesShaylar Patricia, MD  ondansetron (ZOFRAN) 4 MG tablet Take 1 tablet (4 mg total) by mouth every 8 (eight) hours as needed  for nausea or vomiting. 01/18/21  Yes Scheeler, Kermit BaloMatthew J, PA-C  EPINEPHrine (EPIPEN 2-PAK) 0.3 mg/0.3 mL IJ SOAJ injection Use as directed for severe allergic reaction 12/27/20   Marcelyn BruinsPadgett, Shaylar Patricia, MD  levonorgestrel-ethinyl estradiol (ALESSE) 0.1-20 MG-MCG tablet Take 1 tablet by mouth daily. 02/08/21     mometasone (ELOCON) 0.1 % cream mometasone 0.1 % topical cream  APP TOPICALLY TO ITCHY AREA OF BACK ONCE D    [provider]    Allergies    Patient has no known allergies.  Review of Systems   Review of Systems  Constitutional:  Negative for chills, diaphoresis, fatigue and fever.  HENT:  Negative for congestion, rhinorrhea and sneezing.   Eyes: Negative.   Respiratory:  Negative for cough, chest tightness and shortness of breath.   Cardiovascular:  Negative for chest pain and leg swelling.  Gastrointestinal:  Positive for nausea. Negative for abdominal pain, blood in stool, diarrhea and vomiting.  Genitourinary:  Negative for difficulty urinating, flank pain, frequency and hematuria.  Musculoskeletal:  Negative for arthralgias and back pain.  Skin:  Positive for wound. Negative for rash.  Neurological:  Negative for dizziness, speech difficulty, weakness, numbness and headaches.   Physical Exam Updated Vital Signs BP 130/81 (BP Location: Right Arm)   Pulse 84   Temp 99.2 F (37.3 C) (Oral)   Resp 18   Ht 5\' 5"  (1.651 m)   Wt 101.2 kg   LMP 02/11/2021   SpO2 98%   BMI 37.11 kg/m   Physical Exam Constitutional:      Appearance: She is well-developed.  HENT:     Head: Normocephalic and atraumatic.  Eyes:     Pupils: Pupils are equal, round, and reactive to light.  Cardiovascular:     Rate and Rhythm: Normal rate and regular rhythm.     Heart sounds: Normal heart sounds.  Pulmonary:     Effort: Pulmonary effort is normal. No respiratory distress.     Breath sounds: Normal breath sounds. No wheezing or rales.  Chest:     Chest wall: No tenderness.   Abdominal:     General: Bowel sounds are normal.     Palpations: Abdomen is soft.     Tenderness: There is no abdominal tenderness. There is no guarding or rebound.  Musculoskeletal:        General: Normal range of motion.     Cervical back: Normal range of motion and neck supple.     Comments: Large area of fluctuance about 3 to 4 cm to the proximal right thigh with surrounding induration and erythema.  It extends toward the right groin.  Lymphadenopathy:     Cervical: No cervical adenopathy.  Skin:    General: Skin is warm and dry.     Findings: No rash.  Neurological:     Mental Status: She is alert and oriented to person, place, and time.    ED Results / Procedures / Treatments   Labs (all labs ordered are listed, but only abnormal results are displayed) Labs Reviewed - No data to display  EKG None  Radiology CT PELVIS W CONTRAST  Result Date: 02/19/2021 CLINICAL DATA:  Abscess, anal or rectal abscess to proximal right thigh/groin EXAM: CT PELVIS WITH CONTRAST TECHNIQUE: Multidetector CT imaging of the pelvis was performed using the standard protocol following the bolus administration of intravenous contrast. CONTRAST:  OMNIPAQUE IOHEXOL 300 MG/ML  SOLN COMPARISON:  None. FINDINGS: Urinary Tract:  No abnormality visualized. Bowel: Unremarkable visualized pelvic bowel loops. The appendix is normal. Vascular/Lymphatic: Prominent right inguinal lymph nodes which are likely reactive. No significant vascular abnormality is seen. Reproductive:  Unremarkable. Other: In the proximal right anteromedial thigh/groin, there is skin thickening and subcutaneous soft tissue swelling. There is a focal 3.3 x 2.7 x 3.2 cm heterogeneous low-density collection with a peripheral rim of enhancement along the medial margin of this inflammatory change (series 2, image 44, coronal image 34). The there is no extension into the deep muscular compartments or perineal space. Musculoskeletal: No acute  osseous abnormality. No suspicious lytic or blastic lesion. IMPRESSION: Cellulitis of the upper inner right thigh/groin with developing 3.3 x 2.7 x 3.2 cm phlegmon/abscess along the medial margin. This process is superficial involving the subcutaneous fat, without evidence of extension into the deep muscular compartments or perineal space. Reactive right inguinal lymphadenopathy. Electronically Signed   By: Caprice Renshaw   On: 02/19/2021 10:40    Procedures .Marland KitchenIncision and Drainage  Date/Time: 02/19/2021 11:57 AM Performed by: Rolan Bucco, MD Authorized by: Rolan Bucco, MD   Consent:    Consent obtained:  Verbal   Consent given by:  Patient   Risks discussed:  Bleeding, incomplete drainage and pain   Alternatives discussed:  No treatment Universal protocol:    Patient identity confirmed:  Verbally with patient Location:    Type:  Abscess   Location:  Lower extremity   Lower extremity location:  Leg   Leg location:  R upper leg Pre-procedure details:    Skin preparation:  Povidone-iodine Sedation:    Sedation type:  None Anesthesia:    Anesthesia method:  Local infiltration   Local anesthetic:  Lidocaine 1% w/o epi Procedure type:    Complexity:  Simple Procedure details:    Incision types:  Single straight   Wound management:  Probed and deloculated   Drainage:  Purulent   Drainage amount:  Copious   Wound treatment:  Wound left open   Packing materials:  None Post-procedure details:    Procedure completion:  Tolerated well, no immediate complications   Medications Ordered in ED Medications  fentaNYL (SUBLIMAZE) injection 50 mcg (50 mcg Intravenous Given 02/19/21 0925)  clindamycin (CLEOCIN) IVPB 600 mg (0 mg Intravenous Stopped 02/19/21 1050)  iohexol (OMNIPAQUE) 300 MG/ML solution 100 mL (100 mLs Intravenous Contrast Given 02/19/21 0959)  lidocaine (PF) (XYLOCAINE) 1 % injection 10 mL (10 mLs Infiltration Given by Other 02/19/21 1106)  fentaNYL (SUBLIMAZE) injection 50  mcg (50 mcg Intravenous Given 02/19/21 1106)    ED Course  I have reviewed the triage vital signs and the nursing notes.  Pertinent labs & imaging results that were available during my care of the patient were  reviewed by me and considered in my medical decision making (see chart for details).    MDM Rules/Calculators/A&P                          Patient presents with an abscess to her upper thigh.  It was fairly large and going towards the inguinal area.  For this reason a CT scan was performed which did not reveal any evidence of deep tissue infection.  It looks like a superficial abscess.  It was drained by me in the ED.  There was copious amounts of purulent fluid.  She was given fentanyl for pain.  She was discharged home in good condition.  She was advised in wound care instructions.  She was started on doxycycline and given a short course of Vicodin for pain.  Return precautions were given.  Her tetanus shot is up-to-date. Final Clinical Impression(s) / ED Diagnoses Final diagnoses:  Abscess    Rx / DC Orders ED Discharge Orders          Ordered    doxycycline (VIBRAMYCIN) 100 MG capsule  2 times daily        02/19/21 1157    HYDROcodone-acetaminophen (NORCO/VICODIN) 5-325 MG tablet  Every 6 hours PRN        02/19/21 1157             Rolan Bucco, MD 02/19/21 1159

## 2021-02-19 NOTE — ED Notes (Signed)
Patient is resting comfortably. 

## 2021-02-19 NOTE — ED Triage Notes (Signed)
PT states swelling and pain to R inner thigh x 1 week.  No drainage noted.  Labs taken at [facility] ED, but pt waited in waiting room 12 hours before coming here.  Denies fevers.

## 2021-02-20 ENCOUNTER — Other Ambulatory Visit: Payer: Self-pay

## 2021-02-20 ENCOUNTER — Encounter: Payer: No Typology Code available for payment source | Admitting: Plastic Surgery

## 2021-02-20 ENCOUNTER — Ambulatory Visit (INDEPENDENT_AMBULATORY_CARE_PROVIDER_SITE_OTHER): Payer: No Typology Code available for payment source | Admitting: Plastic Surgery

## 2021-02-20 DIAGNOSIS — N62 Hypertrophy of breast: Secondary | ICD-10-CM

## 2021-02-20 NOTE — Progress Notes (Signed)
Patient presents about a month out from bilateral breast reduction.  Overall she is feeling well.  On exam all of her incisions look to be healing well with no signs of any wound problems or healing issues.  She would like to start swimming and going back to the gym which I think is reasonable for her at this point.  We will plan to see her again in about a month.  All of her questions were answered.

## 2021-02-28 ENCOUNTER — Ambulatory Visit (INDEPENDENT_AMBULATORY_CARE_PROVIDER_SITE_OTHER): Payer: No Typology Code available for payment source | Admitting: *Deleted

## 2021-02-28 DIAGNOSIS — J309 Allergic rhinitis, unspecified: Secondary | ICD-10-CM | POA: Diagnosis not present

## 2021-03-07 ENCOUNTER — Ambulatory Visit (INDEPENDENT_AMBULATORY_CARE_PROVIDER_SITE_OTHER): Payer: No Typology Code available for payment source

## 2021-03-07 DIAGNOSIS — J309 Allergic rhinitis, unspecified: Secondary | ICD-10-CM

## 2021-03-14 ENCOUNTER — Ambulatory Visit (INDEPENDENT_AMBULATORY_CARE_PROVIDER_SITE_OTHER): Payer: No Typology Code available for payment source | Admitting: *Deleted

## 2021-03-14 DIAGNOSIS — J309 Allergic rhinitis, unspecified: Secondary | ICD-10-CM | POA: Diagnosis not present

## 2021-03-20 ENCOUNTER — Ambulatory Visit (INDEPENDENT_AMBULATORY_CARE_PROVIDER_SITE_OTHER): Payer: No Typology Code available for payment source | Admitting: Plastic Surgery

## 2021-03-20 ENCOUNTER — Other Ambulatory Visit: Payer: Self-pay

## 2021-03-20 DIAGNOSIS — N62 Hypertrophy of breast: Secondary | ICD-10-CM

## 2021-03-20 NOTE — Progress Notes (Signed)
Patient presents about 2 months postop from bilateral breast reduction.  She feels like things are going fine and does not have any concerns.  On exam incisions are intact.  Nipple areolar complexes are viable.  No obvious subcutaneous fluid.  Shape size and symmetry are good.  We will plan to see her again on an as-needed basis.  All of her questions were answered.

## 2021-03-21 ENCOUNTER — Ambulatory Visit (INDEPENDENT_AMBULATORY_CARE_PROVIDER_SITE_OTHER): Payer: No Typology Code available for payment source | Admitting: *Deleted

## 2021-03-21 DIAGNOSIS — J309 Allergic rhinitis, unspecified: Secondary | ICD-10-CM

## 2021-03-28 ENCOUNTER — Ambulatory Visit (INDEPENDENT_AMBULATORY_CARE_PROVIDER_SITE_OTHER): Payer: No Typology Code available for payment source | Admitting: *Deleted

## 2021-03-28 DIAGNOSIS — J309 Allergic rhinitis, unspecified: Secondary | ICD-10-CM | POA: Diagnosis not present

## 2021-04-04 ENCOUNTER — Ambulatory Visit (INDEPENDENT_AMBULATORY_CARE_PROVIDER_SITE_OTHER): Payer: No Typology Code available for payment source | Admitting: *Deleted

## 2021-04-04 DIAGNOSIS — J309 Allergic rhinitis, unspecified: Secondary | ICD-10-CM

## 2021-04-11 ENCOUNTER — Ambulatory Visit (INDEPENDENT_AMBULATORY_CARE_PROVIDER_SITE_OTHER): Payer: No Typology Code available for payment source

## 2021-04-11 DIAGNOSIS — J309 Allergic rhinitis, unspecified: Secondary | ICD-10-CM | POA: Diagnosis not present

## 2021-04-18 ENCOUNTER — Ambulatory Visit (INDEPENDENT_AMBULATORY_CARE_PROVIDER_SITE_OTHER): Payer: No Typology Code available for payment source | Admitting: *Deleted

## 2021-04-18 DIAGNOSIS — J309 Allergic rhinitis, unspecified: Secondary | ICD-10-CM | POA: Diagnosis not present

## 2021-04-25 ENCOUNTER — Ambulatory Visit (INDEPENDENT_AMBULATORY_CARE_PROVIDER_SITE_OTHER): Payer: No Typology Code available for payment source | Admitting: *Deleted

## 2021-04-25 DIAGNOSIS — J309 Allergic rhinitis, unspecified: Secondary | ICD-10-CM

## 2021-05-02 ENCOUNTER — Ambulatory Visit (INDEPENDENT_AMBULATORY_CARE_PROVIDER_SITE_OTHER): Payer: No Typology Code available for payment source

## 2021-05-02 DIAGNOSIS — J309 Allergic rhinitis, unspecified: Secondary | ICD-10-CM | POA: Diagnosis not present

## 2021-05-08 ENCOUNTER — Ambulatory Visit: Payer: No Typology Code available for payment source | Admitting: Neurology

## 2021-05-09 ENCOUNTER — Ambulatory Visit (INDEPENDENT_AMBULATORY_CARE_PROVIDER_SITE_OTHER): Payer: No Typology Code available for payment source | Admitting: *Deleted

## 2021-05-09 DIAGNOSIS — J309 Allergic rhinitis, unspecified: Secondary | ICD-10-CM

## 2021-05-14 ENCOUNTER — Telehealth: Payer: Self-pay

## 2021-05-14 NOTE — Telephone Encounter (Signed)
Pt reported that the suture near her nipple is sore but the one under breast is not. She doesn't report any swelling, redness, increased pain, n/v or fevers. Pt also reported that she tried to pull them out herself. I adv her not to do that and that I would send a note to [facility] to get her on the schedule to get sutures looked and removed if necessary. Pt conveyed understanding.

## 2021-05-14 NOTE — Telephone Encounter (Signed)
Patient called to let us know that she has two stitches that have not dissolved.  She said that one stitch is under her left breast and the other stitch is by the nipple on her right breast.  Please call.

## 2021-05-16 ENCOUNTER — Ambulatory Visit (INDEPENDENT_AMBULATORY_CARE_PROVIDER_SITE_OTHER): Payer: No Typology Code available for payment source | Admitting: *Deleted

## 2021-05-16 DIAGNOSIS — J309 Allergic rhinitis, unspecified: Secondary | ICD-10-CM | POA: Diagnosis not present

## 2021-05-23 ENCOUNTER — Ambulatory Visit (INDEPENDENT_AMBULATORY_CARE_PROVIDER_SITE_OTHER): Payer: No Typology Code available for payment source | Admitting: *Deleted

## 2021-05-23 DIAGNOSIS — J309 Allergic rhinitis, unspecified: Secondary | ICD-10-CM | POA: Diagnosis not present

## 2021-05-30 ENCOUNTER — Ambulatory Visit (INDEPENDENT_AMBULATORY_CARE_PROVIDER_SITE_OTHER): Payer: No Typology Code available for payment source | Admitting: *Deleted

## 2021-05-30 DIAGNOSIS — J309 Allergic rhinitis, unspecified: Secondary | ICD-10-CM

## 2021-06-13 ENCOUNTER — Ambulatory Visit (INDEPENDENT_AMBULATORY_CARE_PROVIDER_SITE_OTHER): Payer: No Typology Code available for payment source

## 2021-06-13 DIAGNOSIS — J309 Allergic rhinitis, unspecified: Secondary | ICD-10-CM | POA: Diagnosis not present

## 2021-06-20 ENCOUNTER — Ambulatory Visit (INDEPENDENT_AMBULATORY_CARE_PROVIDER_SITE_OTHER): Payer: No Typology Code available for payment source | Admitting: *Deleted

## 2021-06-20 DIAGNOSIS — J309 Allergic rhinitis, unspecified: Secondary | ICD-10-CM | POA: Diagnosis not present

## 2021-07-04 ENCOUNTER — Other Ambulatory Visit (HOSPITAL_COMMUNITY): Payer: Self-pay

## 2021-07-04 ENCOUNTER — Ambulatory Visit (INDEPENDENT_AMBULATORY_CARE_PROVIDER_SITE_OTHER): Payer: No Typology Code available for payment source | Admitting: *Deleted

## 2021-07-04 ENCOUNTER — Encounter: Payer: Self-pay | Admitting: *Deleted

## 2021-07-04 DIAGNOSIS — J309 Allergic rhinitis, unspecified: Secondary | ICD-10-CM

## 2021-07-08 NOTE — Progress Notes (Signed)
Referring:  Melida Quitter, MD [address] [place],  [place] [address]  PCP: Melida Quitter, MD  Neurology was asked to evaluate Tiffany Huffman, a 20 year old female for a chief complaint of headaches.  Our recommendations of care will be communicated by shared medical record.    CC:  headaches  HPI:  Medical co-morbidities: asthma  The patient presents for evaluation of headaches which began in her childhood but have become more frequent over time. She has two types of headaches. The first type is described as sharp pain in her occiput which occurs 1-2 times per week. Pain lasts 5-6 minutes at a time. Second type of headache is described as a frontal aching which can last 20 minutes to an hour. Headaches are not associated with photophobia or phonophobia but she does get nauseated with them. She has headaches 5 days out of the week.  Tried melatonin for her headaches which didn't help. Has tried OTCs which help, but she prefers not to take medication if she can. Usually will not take anything.  Headache History: Onset: childhood Triggers: none Aura: none Location: occipital, front Quality/Description: sharp, aching Associated Symptoms:  Photophobia: none  Phonophobia: none  Nausea: yes Vomiting: no Worse with activity?: yes Duration of headaches: 5-6 minutes for sharp pains, 20 minutes for frontal headache   Pregnancy planning/birth control: OCPs  Headache days per month: 20 Headache free days per month: 10  Current Treatment: Abortive Ibuprofen tylenol  Preventative none  Prior Therapies                                 Ibuprofen tylenol   Headache Risk Factors: Headache risk factors and/or co-morbidities (+) Neck Pain (-) History of Motor Vehicle Accident (-) Sleep Disorder (+) Obesity  Body mass index is 39.82 kg/m. (-) History of Traumatic Brain Injury and/or Concussion  LABS: CBC    Component Value Date/Time   WBC 9.4 02/18/2021 2255   RBC 4.04  02/18/2021 2255   HGB 12.4 02/18/2021 2255   HCT 37.2 02/18/2021 2255   PLT 228 02/18/2021 2255   MCV 92.1 02/18/2021 2255   MCH 30.7 02/18/2021 2255   MCHC 33.3 02/18/2021 2255   RDW 12.3 02/18/2021 2255   LYMPHSABS 1.8 02/18/2021 2255   MONOABS 0.7 02/18/2021 2255   EOSABS 0.2 02/18/2021 2255   BASOSABS 0.0 02/18/2021 2255   BMP Latest Ref Rng & Units 02/18/2021 04/20/2019 03/08/2018  Glucose 70 - 99 mg/dL 751(Z) 80 75  BUN 6 - 20 mg/dL 9 7 8   Creatinine 0.44 - 1.00 mg/dL 0.01 7.49  Sodium 135 - 145 mmol/L 135 138 140  Potassium 3.5 - 5.1 mmol/L 3.7 4.0 3.7  Chloride 98 - 111 mmol/L 104 104 103  CO2 22 - 32 mmol/L 18(L) 27 28  Calcium 8.9 - 10.3 mg/dL 4.49) 8.9 9.0     IMAGING:  none   Current Outpatient Medications on File Prior to Visit  Medication Sig Dispense Refill   albuterol (PROAIR HFA) 108 (90 Base) MCG/ACT inhaler Inhale 2 puffs into the lungs every 4 to 6 hours as needed for wheezing. 8.5 g 1   albuterol (PROVENTIL) (2.5 MG/3ML) 0.083% nebulizer solution Take 3 mLs by nebulization every 4 to 6  hours as needed for cough or wheeze. 150 mL 1   Azelastine HCl 0.15 % SOLN Place 2 sprays into both nostrils 2 (two) times daily. 30  mL 5   Budeson-Glycopyrrol-Formoterol (BREZTRI AEROSPHERE) 160-9-4.8 MCG/ACT AERO Inhale 2 puffs into the lungs in the morning and at bedtime. 10.7 g 5   cetirizine (ZYRTEC) 10 MG tablet Take 1 tablet (10 mg total) by mouth daily. 30 tablet 5   EPINEPHrine (EPIPEN 2-PAK) 0.3 mg/0.3 mL IJ SOAJ injection Use as directed for severe allergic reaction 2 each 2   fluticasone (FLONASE) 50 MCG/ACT nasal spray Place 2 sprays into both nostrils daily. 16 g 2   ibuprofen (ADVIL) 600 MG tablet Take 1 tablet (600 mg total) by mouth 3 (three) times daily with food or milk as needed 30 tablet 1   levonorgestrel-ethinyl estradiol (ALESSE) 0.1-20 MG-MCG tablet Take 1 tablet by mouth daily. 84 tablet 0   mometasone (ELOCON) 0.1 % cream mometasone 0.1 %  topical cream  APP TOPICALLY TO ITCHY AREA OF BACK ONCE D     montelukast (SINGULAIR) 10 MG tablet Take 1 tablet (10 mg total) by mouth at bedtime. 30 tablet 3   Olopatadine HCl 0.2 % SOLN Place 1 drop into both eyes daily. 2.5 mL 3   No current facility-administered medications on file prior to visit.     Allergies: No Known Allergies  Family History: Migraine or other headaches in the family:  brother Aneurysms in a first degree relative:  none Brain tumors in the family:  none Other neurological illness in the family:   none  Past Medical History: Past Medical History:  Diagnosis Date   Asthma    Chipped tooth 01/15/2021   back lower tooth x 1 per pt   Chronic back pain 01/15/2021   due to large breasts per pt   COVID    over 3 months ago per pt on 01-15-2021, asymptomatic   Daily headache    Eczema 01/15/2021   Headache 01/15/2021   occ migraines per pt   Wears contact lenses 01/15/2021   Wears glasses 01/15/2021    Past Surgical History Past Surgical History:  Procedure Laterality Date   BREAST REDUCTION SURGERY Bilateral 01/21/2021   Procedure: MAMMARY REDUCTION  (BREAST);  Surgeon: Allena Napoleon, MD;  Location: [facility];  Service: Plastics;  Laterality: Bilateral;  2 hours   TYMPANOSTOMY TUBE PLACEMENT     as child   WISDOM TOOTH EXTRACTION  2017    Social History: Social History   Tobacco Use   Smoking status: Never    Passive exposure: Yes   Smokeless tobacco: Never  Vaping Use   Vaping Use: Never used  Substance Use Topics   Alcohol use: Yes    Comment: occ 2-4 a month   Drug use: Yes    Types: Marijuana    Comment: last used May 24th    ROS: Negative for fevers, chills. Positive for headaches. All other systems reviewed and negative unless stated otherwise in HPI.   Physical Exam:   Vital Signs: BP 122/76   Pulse 63   Ht 5\' 4"  (1.626 m)   Wt 232 lb (105.2 kg)   SpO2 95%   BMI 39.82 kg/m  GENERAL: well  appearing,in no acute distress,alert SKIN:  Color, texture, turgor normal. No rashes or lesions HEAD:  Normocephalic/atraumatic. CV:  RRR RESP: Normal respiratory effort MSK: +tenderness to palpation over L>R occiput, neck, and shoulders  NEUROLOGICAL: Mental Status: Alert, oriented to person, place and time,Follows commands Cranial Nerves: PERRL,visual fields intact to confrontation,extraocular movements intact,facial sensation intact,no facial droop or ptosis,hearing intact to finger rub bilaterally,no dysarthria Motor:  muscle strength 5/5 both upper and lower extremities,no drift, normal tone Reflexes: 2+ throughout Sensation: intact to light touch all 4 extremities Coordination: Finger-to- nose-finger intact bilaterally Gait: normal-based   IMPRESSION: 20 year old female with a history of asthma who presents for evaluation of headaches. Her headache pattern is most consistent with chronic tension type headache. 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 Will start Topamax for headache prevention and Flexeril as needed for headaches/neck spasms. Offered neck PT, but patient would prefer to hold off at this time.  PLAN: -Preventive: Start Topamax 25 mg once increase dose by 25 mg each week as tolerated -- up to 100 mg/day -Rescue: Flexeril 5 mg as needed for neck pain. Continue ibuprofen PRN -next steps: Consider protriptyline, SNRI, or gabapentin for headache prevention. Consider neck PT, occipital nerve block   I spent a total of 30 minutes chart reviewing and counseling the patient. Headache education was done. Discussed treatment options including preventive and acute medications and physical therapy. Discussed medication side effects, adverse reactions and drug interactions. Written educational materials and patient instructions outlining all of the above were given.  Follow-up: 3 months   Ocie Doyne, MD 07/09/2021   8:56 AM

## 2021-07-09 ENCOUNTER — Other Ambulatory Visit (HOSPITAL_COMMUNITY): Payer: Self-pay

## 2021-07-09 ENCOUNTER — Ambulatory Visit: Payer: No Typology Code available for payment source | Admitting: Psychiatry

## 2021-07-09 ENCOUNTER — Other Ambulatory Visit: Payer: Self-pay

## 2021-07-09 ENCOUNTER — Encounter: Payer: Self-pay | Admitting: Psychiatry

## 2021-07-09 VITALS — BP 122/76 | HR 63 | Ht 64.0 in | Wt 232.0 lb

## 2021-07-09 DIAGNOSIS — M5481 Occipital neuralgia: Secondary | ICD-10-CM

## 2021-07-09 DIAGNOSIS — G44229 Chronic tension-type headache, not intractable: Secondary | ICD-10-CM

## 2021-07-09 MED ORDER — TOPIRAMATE 25 MG PO TABS
ORAL_TABLET | ORAL | 0 refills | Status: DC
  Filled 2021-07-09: qty 70, 28d supply, fill #0

## 2021-07-09 MED ORDER — CYCLOBENZAPRINE HCL 5 MG PO TABS
5.0000 mg | ORAL_TABLET | Freq: Every evening | ORAL | 2 refills | Status: DC | PRN
  Filled 2021-07-09: qty 30, 30d supply, fill #0

## 2021-07-09 NOTE — Patient Instructions (Addendum)
Start Topamax for headache prevention. Take 25 mg (1 pill) at bedtime for one week, then increase to 50 mg (2 pills) at bedtime for one week, then take 75 mg (3 pills) at bedtime for one week, then take 100 mg (4 pills) at bedtime Take Flexeril (cyclobenzaprine) as needed for neck tension and headaches

## 2021-07-11 ENCOUNTER — Ambulatory Visit (INDEPENDENT_AMBULATORY_CARE_PROVIDER_SITE_OTHER): Payer: No Typology Code available for payment source

## 2021-07-11 DIAGNOSIS — J309 Allergic rhinitis, unspecified: Secondary | ICD-10-CM

## 2021-07-18 ENCOUNTER — Ambulatory Visit (INDEPENDENT_AMBULATORY_CARE_PROVIDER_SITE_OTHER): Payer: No Typology Code available for payment source | Admitting: *Deleted

## 2021-07-18 DIAGNOSIS — J309 Allergic rhinitis, unspecified: Secondary | ICD-10-CM

## 2021-07-25 ENCOUNTER — Ambulatory Visit (INDEPENDENT_AMBULATORY_CARE_PROVIDER_SITE_OTHER): Payer: No Typology Code available for payment source

## 2021-07-25 DIAGNOSIS — J309 Allergic rhinitis, unspecified: Secondary | ICD-10-CM | POA: Diagnosis not present

## 2021-08-01 ENCOUNTER — Ambulatory Visit (INDEPENDENT_AMBULATORY_CARE_PROVIDER_SITE_OTHER): Payer: No Typology Code available for payment source | Admitting: *Deleted

## 2021-08-01 DIAGNOSIS — J309 Allergic rhinitis, unspecified: Secondary | ICD-10-CM

## 2021-08-15 ENCOUNTER — Ambulatory Visit (INDEPENDENT_AMBULATORY_CARE_PROVIDER_SITE_OTHER): Payer: No Typology Code available for payment source

## 2021-08-15 DIAGNOSIS — J309 Allergic rhinitis, unspecified: Secondary | ICD-10-CM

## 2021-08-22 ENCOUNTER — Ambulatory Visit (INDEPENDENT_AMBULATORY_CARE_PROVIDER_SITE_OTHER): Payer: No Typology Code available for payment source

## 2021-08-22 DIAGNOSIS — J309 Allergic rhinitis, unspecified: Secondary | ICD-10-CM

## 2021-08-27 ENCOUNTER — Other Ambulatory Visit (HOSPITAL_COMMUNITY): Payer: Self-pay

## 2021-08-27 ENCOUNTER — Other Ambulatory Visit: Payer: Self-pay | Admitting: Allergy

## 2021-08-27 DIAGNOSIS — J4540 Moderate persistent asthma, uncomplicated: Secondary | ICD-10-CM

## 2021-08-27 MED ORDER — ALBUTEROL SULFATE HFA 108 (90 BASE) MCG/ACT IN AERS
2.0000 | INHALATION_SPRAY | RESPIRATORY_TRACT | 5 refills | Status: DC | PRN
  Filled 2021-08-27: qty 8.5, 17d supply, fill #0
  Filled 2021-11-01: qty 8.5, 17d supply, fill #1
  Filled 2021-12-12: qty 8.5, 17d supply, fill #2
  Filled 2022-02-19 – 2022-07-18 (×2): qty 8.5, 17d supply, fill #3

## 2021-08-28 DIAGNOSIS — J3081 Allergic rhinitis due to animal (cat) (dog) hair and dander: Secondary | ICD-10-CM | POA: Diagnosis not present

## 2021-08-28 NOTE — Progress Notes (Signed)
VIALS EXP 08-28-22 °

## 2021-09-19 ENCOUNTER — Ambulatory Visit (INDEPENDENT_AMBULATORY_CARE_PROVIDER_SITE_OTHER): Payer: No Typology Code available for payment source

## 2021-09-19 DIAGNOSIS — J309 Allergic rhinitis, unspecified: Secondary | ICD-10-CM | POA: Diagnosis not present

## 2021-09-26 ENCOUNTER — Ambulatory Visit (INDEPENDENT_AMBULATORY_CARE_PROVIDER_SITE_OTHER): Payer: No Typology Code available for payment source | Admitting: *Deleted

## 2021-09-26 DIAGNOSIS — J309 Allergic rhinitis, unspecified: Secondary | ICD-10-CM

## 2021-10-03 ENCOUNTER — Ambulatory Visit (INDEPENDENT_AMBULATORY_CARE_PROVIDER_SITE_OTHER): Payer: No Typology Code available for payment source | Admitting: *Deleted

## 2021-10-03 DIAGNOSIS — J309 Allergic rhinitis, unspecified: Secondary | ICD-10-CM | POA: Diagnosis not present

## 2021-10-10 ENCOUNTER — Ambulatory Visit (INDEPENDENT_AMBULATORY_CARE_PROVIDER_SITE_OTHER): Payer: No Typology Code available for payment source | Admitting: *Deleted

## 2021-10-10 DIAGNOSIS — J309 Allergic rhinitis, unspecified: Secondary | ICD-10-CM

## 2021-10-17 ENCOUNTER — Encounter: Payer: Self-pay | Admitting: Psychiatry

## 2021-10-17 ENCOUNTER — Ambulatory Visit: Payer: No Typology Code available for payment source | Admitting: Psychiatry

## 2021-10-17 NOTE — Progress Notes (Deleted)
? ?  CC:  headaches ? ?Follow-up Visit ? ?Last visit: 07/09/22 ? ?Brief HPI: ?21 year old female with a history of asthma who follows in clinic for chronic tension headaches and occipital neuralgia. ? ?At her last visit she was started on Topamax for headache prevention and Flexeril for neck tension. ? ?Interval History: ?*** ? ? ?Headache days per month: *** ?Headache free days per month: *** ?Headache severity: *** ? ?Current Headache Regimen: ?Preventative: *** ?Abortive: *** ? ?# of doses of abortive medications per month: *** ? ?Prior Therapies                                  ?*** ? ?Physical Exam:  ? ?Vital Signs: ?There were no vitals taken for this visit. ?GENERAL:  well appearing, in no acute distress, alert  ?SKIN:  Color, texture, turgor normal. No rashes or lesions ?HEAD:  Normocephalic/atraumatic. ?RESP: normal respiratory effort ?MSK:  No gross joint deformities.  ? ?NEUROLOGICAL: ?Mental Status: Alert, oriented to person, place and time, Follows commands, and Speech fluent and appropriate. ?Cranial Nerves: PERRL, face symmetric, no dysarthria, hearing grossly intact ?Motor: moves all extremities equally ?Gait: normal-based. ? ?IMPRESSION: ?*** ? ?PLAN: ?*** ? ? ?Follow-up: *** ? ?I spent a total of *** minutes on the date of the service. Headache education was done. Discussed lifestyle modification including increased oral hydration, decreased caffeine, exercise and stress management. Discussed treatment options including preventive and acute medications, natural supplements, and infusion therapy. Discussed medication overuse headache and to limit use of acute treatments to no more than 2 days/week or 10 days/month. Discussed medication side effects, adverse reactions and drug interactions. Written educational materials and patient instructions outlining all of the above were given. ? ?Ocie Doyne, MD ? ?

## 2021-10-24 ENCOUNTER — Ambulatory Visit (INDEPENDENT_AMBULATORY_CARE_PROVIDER_SITE_OTHER): Payer: No Typology Code available for payment source

## 2021-10-24 DIAGNOSIS — J309 Allergic rhinitis, unspecified: Secondary | ICD-10-CM

## 2021-10-29 ENCOUNTER — Other Ambulatory Visit (HOSPITAL_COMMUNITY): Payer: Self-pay

## 2021-11-01 ENCOUNTER — Other Ambulatory Visit (HOSPITAL_COMMUNITY): Payer: Self-pay

## 2021-11-13 ENCOUNTER — Ambulatory Visit (INDEPENDENT_AMBULATORY_CARE_PROVIDER_SITE_OTHER): Payer: No Typology Code available for payment source | Admitting: *Deleted

## 2021-11-13 DIAGNOSIS — J309 Allergic rhinitis, unspecified: Secondary | ICD-10-CM | POA: Diagnosis not present

## 2021-12-05 ENCOUNTER — Ambulatory Visit (INDEPENDENT_AMBULATORY_CARE_PROVIDER_SITE_OTHER): Payer: No Typology Code available for payment source

## 2021-12-05 DIAGNOSIS — J309 Allergic rhinitis, unspecified: Secondary | ICD-10-CM | POA: Diagnosis not present

## 2021-12-12 ENCOUNTER — Ambulatory Visit (INDEPENDENT_AMBULATORY_CARE_PROVIDER_SITE_OTHER): Payer: No Typology Code available for payment source

## 2021-12-12 ENCOUNTER — Other Ambulatory Visit (HOSPITAL_COMMUNITY): Payer: Self-pay

## 2021-12-12 DIAGNOSIS — J309 Allergic rhinitis, unspecified: Secondary | ICD-10-CM | POA: Diagnosis not present

## 2022-01-07 ENCOUNTER — Ambulatory Visit (INDEPENDENT_AMBULATORY_CARE_PROVIDER_SITE_OTHER): Payer: No Typology Code available for payment source

## 2022-01-07 DIAGNOSIS — J309 Allergic rhinitis, unspecified: Secondary | ICD-10-CM | POA: Diagnosis not present

## 2022-01-21 ENCOUNTER — Ambulatory Visit (INDEPENDENT_AMBULATORY_CARE_PROVIDER_SITE_OTHER): Payer: No Typology Code available for payment source

## 2022-01-21 DIAGNOSIS — J309 Allergic rhinitis, unspecified: Secondary | ICD-10-CM

## 2022-02-06 ENCOUNTER — Ambulatory Visit (INDEPENDENT_AMBULATORY_CARE_PROVIDER_SITE_OTHER): Payer: No Typology Code available for payment source

## 2022-02-06 DIAGNOSIS — J309 Allergic rhinitis, unspecified: Secondary | ICD-10-CM

## 2022-02-19 ENCOUNTER — Other Ambulatory Visit (HOSPITAL_COMMUNITY): Payer: Self-pay

## 2022-02-25 ENCOUNTER — Ambulatory Visit (INDEPENDENT_AMBULATORY_CARE_PROVIDER_SITE_OTHER): Payer: No Typology Code available for payment source

## 2022-02-25 DIAGNOSIS — J309 Allergic rhinitis, unspecified: Secondary | ICD-10-CM | POA: Diagnosis not present

## 2022-02-26 ENCOUNTER — Other Ambulatory Visit (HOSPITAL_COMMUNITY): Payer: Self-pay

## 2022-02-26 MED ORDER — TOPIRAMATE 25 MG PO TABS
25.0000 mg | ORAL_TABLET | Freq: Every day | ORAL | 0 refills | Status: DC
  Filled 2022-02-26: qty 90, 90d supply, fill #0

## 2022-03-04 ENCOUNTER — Ambulatory Visit (INDEPENDENT_AMBULATORY_CARE_PROVIDER_SITE_OTHER): Payer: No Typology Code available for payment source

## 2022-03-04 DIAGNOSIS — J309 Allergic rhinitis, unspecified: Secondary | ICD-10-CM | POA: Diagnosis not present

## 2022-03-06 ENCOUNTER — Other Ambulatory Visit (HOSPITAL_COMMUNITY): Payer: Self-pay

## 2022-03-31 ENCOUNTER — Other Ambulatory Visit (HOSPITAL_COMMUNITY): Payer: Self-pay

## 2022-03-31 MED ORDER — ALBUTEROL SULFATE HFA 108 (90 BASE) MCG/ACT IN AERS
2.0000 | INHALATION_SPRAY | RESPIRATORY_TRACT | 2 refills | Status: DC | PRN
  Filled 2022-03-31: qty 6.7, 17d supply, fill #0
  Filled 2022-07-18: qty 6.7, 17d supply, fill #1

## 2022-07-04 DIAGNOSIS — Z419 Encounter for procedure for purposes other than remedying health state, unspecified: Secondary | ICD-10-CM | POA: Diagnosis not present

## 2022-07-18 ENCOUNTER — Other Ambulatory Visit (HOSPITAL_COMMUNITY): Payer: Self-pay

## 2022-08-04 DIAGNOSIS — Z419 Encounter for procedure for purposes other than remedying health state, unspecified: Secondary | ICD-10-CM | POA: Diagnosis not present

## 2022-08-06 ENCOUNTER — Emergency Department (HOSPITAL_BASED_OUTPATIENT_CLINIC_OR_DEPARTMENT_OTHER)
Admission: EM | Admit: 2022-08-06 | Discharge: 2022-08-07 | Discharge status: Against Medical Advice | Payer: 59 | Attending: Emergency Medicine | Admitting: Emergency Medicine

## 2022-08-06 ENCOUNTER — Encounter (HOSPITAL_BASED_OUTPATIENT_CLINIC_OR_DEPARTMENT_OTHER): Payer: Self-pay

## 2022-08-06 ENCOUNTER — Other Ambulatory Visit: Payer: Self-pay

## 2022-08-06 DIAGNOSIS — Z5321 Procedure and treatment not carried out due to patient leaving prior to being seen by health care provider: Secondary | ICD-10-CM | POA: Insufficient documentation

## 2022-08-06 DIAGNOSIS — G43909 Migraine, unspecified, not intractable, without status migrainosus: Secondary | ICD-10-CM | POA: Diagnosis not present

## 2022-08-06 DIAGNOSIS — J029 Acute pharyngitis, unspecified: Secondary | ICD-10-CM | POA: Diagnosis not present

## 2022-08-06 LAB — GROUP A STREP BY PCR: Group A Strep by PCR: DETECTED — AB

## 2022-08-06 NOTE — ED Triage Notes (Signed)
Pt states she thinks she has strep- white exudate at back of throat, hurts to swallow & talk, associated migraine HA, "weak on the stomach," symptom onset today

## 2022-08-07 ENCOUNTER — Other Ambulatory Visit (HOSPITAL_COMMUNITY): Payer: Self-pay

## 2022-08-07 MED ORDER — PENICILLIN V POTASSIUM 500 MG PO TABS
500.0000 mg | ORAL_TABLET | Freq: Two times a day (BID) | ORAL | 0 refills | Status: DC
  Filled 2022-08-07: qty 20, 10d supply, fill #0

## 2022-08-25 ENCOUNTER — Telehealth: Payer: Self-pay

## 2022-08-25 NOTE — Telephone Encounter (Signed)
Called pt to confirm appointment for tomorrow.  Pt verified appt date/time. Vm full

## 2022-08-27 ENCOUNTER — Other Ambulatory Visit (HOSPITAL_COMMUNITY): Payer: Self-pay

## 2022-08-27 ENCOUNTER — Other Ambulatory Visit (HOSPITAL_COMMUNITY)
Admission: RE | Admit: 2022-08-27 | Discharge: 2022-08-27 | Disposition: A | Discharge status: Home / Self Care | Payer: 59 | Source: Ambulatory Visit | Attending: Family Medicine | Admitting: Family Medicine

## 2022-08-27 ENCOUNTER — Encounter: Payer: Self-pay | Admitting: Family Medicine

## 2022-08-27 ENCOUNTER — Ambulatory Visit (INDEPENDENT_AMBULATORY_CARE_PROVIDER_SITE_OTHER): Payer: 59 | Admitting: Family Medicine

## 2022-08-27 VITALS — BP 115/78 | HR 75 | Ht 65.0 in | Wt 212.0 lb

## 2022-08-27 DIAGNOSIS — J45909 Unspecified asthma, uncomplicated: Secondary | ICD-10-CM | POA: Insufficient documentation

## 2022-08-27 DIAGNOSIS — Z01419 Encounter for gynecological examination (general) (routine) without abnormal findings: Secondary | ICD-10-CM | POA: Diagnosis not present

## 2022-08-27 DIAGNOSIS — N920 Excessive and frequent menstruation with regular cycle: Secondary | ICD-10-CM

## 2022-08-27 DIAGNOSIS — Z113 Encounter for screening for infections with a predominantly sexual mode of transmission: Secondary | ICD-10-CM | POA: Diagnosis not present

## 2022-08-27 DIAGNOSIS — Z124 Encounter for screening for malignant neoplasm of cervix: Secondary | ICD-10-CM | POA: Insufficient documentation

## 2022-08-27 DIAGNOSIS — Z3049 Encounter for surveillance of other contraceptives: Secondary | ICD-10-CM | POA: Diagnosis not present

## 2022-08-27 DIAGNOSIS — N898 Other specified noninflammatory disorders of vagina: Secondary | ICD-10-CM

## 2022-08-27 DIAGNOSIS — F5081 Binge eating disorder: Secondary | ICD-10-CM | POA: Insufficient documentation

## 2022-08-27 MED ORDER — ETONOGESTREL-ETHINYL ESTRADIOL 0.12-0.015 MG/24HR VA RING
VAGINAL_RING | VAGINAL | 3 refills | Status: DC
  Filled 2022-08-27: qty 3, 84d supply, fill #0
  Filled 2022-11-18 – 2022-12-15 (×2): qty 3, 84d supply, fill #1
  Filled 2023-03-05: qty 1, 28d supply, fill #2
  Filled 2023-04-14 – 2023-04-17 (×2): qty 1, 28d supply, fill #3

## 2022-08-27 NOTE — Progress Notes (Signed)
Subjective:     Tiffany Huffman is a 22 y.o. female and is here for a comprehensive physical exam. The patient reports problems - heavy cycles .  Cycles are painful and heavy and last 10 days. Have been this way for six months. Previously on OCPs and then she had bleeding x 2 months and stopped and then cycles got longer and heavier.   The following portions of the patient's history were reviewed and updated as appropriate: allergies, current medications, past family history, past medical history, past social history, past surgical history, and problem list.  Review of Systems Pertinent items noted in HPI and remainder of comprehensive ROS otherwise negative.   Objective:    BP 115/78   Pulse 75   Ht 5\' 5"  (1.651 m)   Wt 212 lb (96.2 kg)   LMP 08/08/2022 (Approximate)   BMI 35.28 kg/m  General appearance: alert, cooperative, and appears stated age Head: Normocephalic, without obvious abnormality, atraumatic Neck: no adenopathy, supple, symmetrical, trachea midline, and thyroid not enlarged, symmetric, no tenderness/mass/nodules Lungs: clear to auscultation bilaterally Breasts: normal appearance, no masses or tenderness Heart: regular rate and rhythm, S1, S2 normal, no murmur, click, rub or gallop Abdomen: soft, non-tender; bowel sounds normal; no masses,  no organomegaly Pelvic: cervix normal in appearance, external genitalia normal, no adnexal masses or tenderness, no cervical motion tenderness, uterus normal size, shape, and consistency, and vagina normal without discharge Extremities: extremities normal, atraumatic, no cyanosis or edema Pulses: 2+ and symmetric Skin: Skin color, texture, turgor normal. No rashes or lesions Lymph nodes: Cervical, supraclavicular, and axillary nodes normal. Neurologic: Grossly normal    Assessment:    Healthy female exam.      Plan:   Problem List Items Addressed This Visit       Unprioritized   Menorrhagia with regular cycle     99214 - check CBC and TSH. Consider u/s if not responsive to Nuvaring.      Relevant Orders   CBC   TSH   Other Visit Diagnoses     Screening for cervical cancer    -  Primary   [phone]   Relevant Orders   Cytology - PAP   Vaginal odor       99214 - check wet prep and treat appropriately   Relevant Orders   Cervicovaginal ancillary only( [place])   Screening for STD (sexually transmitted disease)       73710   Relevant Orders   RPR+HBsAg+HCVAb+...   Encounter for gynecological examination without abnormal finding       [phone] - needs pap   Encounter for initial management of intravaginal contraceptive       [phone] - will begin NuvaRing after next cycle - wanted patch, but due to weight will hold on that.   Relevant Medications   etonogestrel-ethinyl estradiol (NUVARING) 0.12-0.015 MG/24HR vaginal ring       Return in 1 year (on 08/28/2023).    See After Visit Summary for Counseling Recommendations

## 2022-08-27 NOTE — Assessment & Plan Note (Signed)
76811 - check CBC and TSH. Consider u/s if not responsive to Nuvaring.

## 2022-08-27 NOTE — Patient Instructions (Signed)

## 2022-08-27 NOTE — Progress Notes (Signed)
Patient presents for Annual.  LMP:08/08/2022 Last pap:Will do today.  Contraception:None wants to discuss  Mammogram:N/A STD Screening: Desires  Flu Vaccine : Already received   CC: Heavy periods and vaginal odor.   Fun Fact: Patient plays the Flute.

## 2022-08-28 ENCOUNTER — Other Ambulatory Visit (HOSPITAL_COMMUNITY): Payer: Self-pay

## 2022-08-28 LAB — CBC
Hematocrit: 38.2 % (ref 34.0–46.6)
Hemoglobin: 12.4 g/dL (ref 11.1–15.9)
MCH: 29.8 pg (ref 26.6–33.0)
MCHC: 32.5 g/dL (ref 31.5–35.7)
MCV: 92 fL (ref 79–97)
Platelets: 226 10*3/uL (ref 150–450)
RBC: 4.16 x10E6/uL (ref 3.77–5.28)
RDW: 12.1 % (ref 11.7–15.4)
WBC: 4.7 10*3/uL (ref 3.4–10.8)

## 2022-08-28 LAB — TSH: TSH: 0.645 u[IU]/mL (ref 0.450–4.500)

## 2022-08-28 LAB — CERVICOVAGINAL ANCILLARY ONLY
Bacterial Vaginitis (gardnerella): POSITIVE — AB
Candida Glabrata: NEGATIVE
Candida Vaginitis: NEGATIVE
Chlamydia: NEGATIVE
Comment: NEGATIVE
Comment: NEGATIVE
Comment: NEGATIVE
Comment: NEGATIVE
Comment: NEGATIVE
Comment: NORMAL
Neisseria Gonorrhea: NEGATIVE
Trichomonas: NEGATIVE

## 2022-08-28 LAB — RPR+HBSAG+HCVAB+...
HIV Screen 4th Generation wRfx: NONREACTIVE
Hep C Virus Ab: NONREACTIVE
Hepatitis B Surface Ag: NEGATIVE
RPR Ser Ql: NONREACTIVE

## 2022-08-28 MED ORDER — METRONIDAZOLE 500 MG PO TABS
500.0000 mg | ORAL_TABLET | Freq: Two times a day (BID) | ORAL | 0 refills | Status: DC
  Filled 2022-08-28: qty 14, 7d supply, fill #0

## 2022-08-28 NOTE — Addendum Note (Signed)
Addended by: Donnamae Jude on: 08/28/2022 05:29 PM   Modules accepted: Orders

## 2022-09-02 LAB — CYTOLOGY - PAP: Diagnosis: NEGATIVE

## 2022-09-04 DIAGNOSIS — Z419 Encounter for procedure for purposes other than remedying health state, unspecified: Secondary | ICD-10-CM | POA: Diagnosis not present

## 2022-09-05 ENCOUNTER — Ambulatory Visit (INDEPENDENT_AMBULATORY_CARE_PROVIDER_SITE_OTHER): Payer: 59 | Admitting: Allergy

## 2022-09-05 ENCOUNTER — Other Ambulatory Visit (HOSPITAL_COMMUNITY): Payer: Self-pay

## 2022-09-05 ENCOUNTER — Other Ambulatory Visit: Payer: Self-pay

## 2022-09-05 ENCOUNTER — Encounter: Payer: Self-pay | Admitting: Allergy

## 2022-09-05 VITALS — BP 126/68 | HR 76 | Temp 98.6°F | Resp 16 | Ht 65.0 in | Wt 214.8 lb

## 2022-09-05 DIAGNOSIS — J309 Allergic rhinitis, unspecified: Secondary | ICD-10-CM | POA: Diagnosis not present

## 2022-09-05 DIAGNOSIS — J4540 Moderate persistent asthma, uncomplicated: Secondary | ICD-10-CM

## 2022-09-05 DIAGNOSIS — H1013 Acute atopic conjunctivitis, bilateral: Secondary | ICD-10-CM | POA: Diagnosis not present

## 2022-09-05 DIAGNOSIS — L2089 Other atopic dermatitis: Secondary | ICD-10-CM | POA: Diagnosis not present

## 2022-09-05 DIAGNOSIS — H1010 Acute atopic conjunctivitis, unspecified eye: Secondary | ICD-10-CM

## 2022-09-05 MED ORDER — CETIRIZINE HCL 10 MG PO TABS
10.0000 mg | ORAL_TABLET | Freq: Every day | ORAL | 5 refills | Status: DC
  Filled 2022-09-05: qty 30, 30d supply, fill #0

## 2022-09-05 MED ORDER — ALBUTEROL SULFATE HFA 108 (90 BASE) MCG/ACT IN AERS
2.0000 | INHALATION_SPRAY | RESPIRATORY_TRACT | 1 refills | Status: DC | PRN
  Filled 2022-09-05: qty 6.7, 25d supply, fill #0
  Filled 2022-10-03: qty 6.7, 17d supply, fill #0
  Filled 2022-11-18 – 2022-12-15 (×2): qty 6.7, 17d supply, fill #1

## 2022-09-05 MED ORDER — MOMETASONE FUROATE 0.1 % EX CREA
TOPICAL_CREAM | Freq: Every day | CUTANEOUS | 3 refills | Status: DC
  Filled 2022-09-05: qty 45, 30d supply, fill #0

## 2022-09-05 MED ORDER — MONTELUKAST SODIUM 10 MG PO TABS
10.0000 mg | ORAL_TABLET | Freq: Every day | ORAL | 5 refills | Status: DC
  Filled 2022-09-05: qty 30, 30d supply, fill #0

## 2022-09-05 MED ORDER — EPINEPHRINE 0.3 MG/0.3ML IJ SOAJ
INTRAMUSCULAR | 2 refills | Status: DC
  Filled 2022-09-05: qty 2, 30d supply, fill #0

## 2022-09-05 MED ORDER — BUDESONIDE-FORMOTEROL FUMARATE 160-4.5 MCG/ACT IN AERO
2.0000 | INHALATION_SPRAY | Freq: Two times a day (BID) | RESPIRATORY_TRACT | 5 refills | Status: DC
  Filled 2022-09-05 – 2022-10-03 (×2): qty 10.2, 30d supply, fill #0
  Filled 2022-11-18: qty 10.2, 30d supply, fill #1

## 2022-09-05 NOTE — Progress Notes (Signed)
Follow-up Note  RE: Tiffany Huffman MRN: [id number] DOB: 06-Jan-2001 Date of Office Visit: 09/05/2022   History of present illness: Tiffany Huffman is a 22 y.o. female presenting today for follow-up of asthma, allergic rhinitis with conjunctivitis.  She was last seen in the office on 11/15/20 by myself.    She states her allergy shots were helping when she was on them.  Her last injection was on 03/04/22 and she was in the maintenance vial.   She feels her allergy symptoms are worse now.  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  She continues on zyrtec and singulair.   Currently doesn't have any eye drops.  She does get flonase from pharmacy and will use as needed for congestion as needed.  She states with her asthma she is noticing more wheezing and shortness of breath mostly occur at night.  She is exercising more which may be triggering as well and will use albuterol beforehand if she remembers.  She has been out of maintenance medications for a mild.  She was last using Breztri but has been out of a while.  She however has not required any ED or urgent care visits or systemic steroid needs since her last visit.  She states she does have a itchy patchy rash in the middle of her back but she has been using steroid cream on that has been helpful.  Review of systems: Review of Systems  Constitutional: Negative.   HENT:  Positive for congestion, postnasal drip and sneezing.        See HPI  Eyes: Negative.   Respiratory:  Positive for shortness of breath and wheezing.   Cardiovascular: Negative.   Gastrointestinal: Negative.   Musculoskeletal: Negative.   Skin:  Positive for rash.  Allergic/Immunologic: Negative.   Neurological: Negative.      All other systems negative unless noted above in HPI  Past  medical/social/surgical/family history have been reviewed and are unchanged unless specifically indicated below.  No changes  Medication List: Current Outpatient Medications  Medication Sig Dispense Refill   albuterol (PROVENTIL) (2.5 MG/3ML) 0.083% nebulizer solution Take 3 mLs by nebulization every 4 to 6  hours as needed for cough or wheeze. 150 mL 1   albuterol (VENTOLIN HFA) 108 (90 Base) MCG/ACT inhaler Inhale 2 puffs into the lungs every 4 (four) hours as needed for wheezing or shortness of breath 6.7 g 2   Azelastine HCl 0.15 % SOLN Place 2 sprays into both nostrils 2 (two) times daily. 30 mL 5   budesonide-formoterol (SYMBICORT) 160-4.5 MCG/ACT inhaler Inhale 2 puffs into the lungs 2 (two) times daily. 10.2 g 5   etonogestrel-ethinyl estradiol (NUVARING) 0.12-0.015 MG/24HR vaginal ring Insert vaginally and leave in place for 3 consecutive weeks, then remove for 1 week. 3 each 3   fluticasone (FLONASE) 50 MCG/ACT nasal spray Place 2 sprays into both nostrils daily. 16 g 2   ibuprofen (ADVIL) 600 MG tablet Take 1 tablet (600 mg total) by mouth 3 (three) times daily with food or milk as needed 30 tablet 1   metroNIDAZOLE (FLAGYL) 500 MG tablet Take 1 tablet (500 mg total) by mouth 2 (two) times daily. 14 tablet 0   Olopatadine HCl 0.2 % SOLN Place 1 drop into both eyes daily. 2.5 mL 3   topiramate (TOPAMAX) 25  MG tablet Take 1 tablet (25 mg total) by mouth daily. 90 tablet 0   albuterol (VENTOLIN HFA) 108 (90 Base) MCG/ACT inhaler Inhale 2 puffs into the lungs every 4 (four) hours as needed for wheezing or shortness of breath. 18 g 1   cetirizine (ZYRTEC) 10 MG tablet Take 1 tablet (10 mg total) by mouth daily. 30 tablet 5   EPINEPHrine (EPIPEN 2-PAK) 0.3 mg/0.3 mL IJ SOAJ injection Use as directed for severe allergic reaction 2 each 2   mometasone (ELOCON) 0.1 % cream Apply topically daily. 45 g 3   montelukast (SINGULAIR) 10 MG tablet Take 1 tablet (10 mg total) by mouth at bedtime. 30  tablet 5   No current facility-administered medications for this visit.     Known medication allergies: No Known Allergies   Physical examination: Blood pressure 126/68, pulse 76, temperature 98.6 F (37 C), resp. rate 16, height 5\' 5"  (1.651 m), weight 214 lb 12.8 oz (97.4 kg), last menstrual period 08/08/2022, SpO2 100 %.  General: Alert, interactive, in no acute distress. HEENT: PERRLA, TMs pearly gray, turbinates mildly edematous without discharge, post-pharynx non erythematous. Neck: Supple without lymphadenopathy. Lungs: Clear to auscultation without wheezing, rhonchi or rales. {no increased work of breathing. CV: Normal S1, S2 without murmurs. Abdomen: Nondistended, nontender. Skin: Dry scaly patch in the middle low back . Extremities:  No clubbing, cyanosis or edema. Neuro:   Grossly intact.  Diagnositics/Labs: Spirometry: FEV1: 2.92L 99%, FVC: 3.78L 112%, ratio consistent with nonobstructive pattern  Assessment and plan: Moderate persistent asthma  - Lung function looks great today!  - start Symbicort 127mcg 2 puffs twice a day with spacer device  - continue montelukast 10 mg one tablet once a day  - ProAir HFA 2 puffs or albuterol 1 vial nebulizer every 4-6 hours as needed for cough/wheeze/shortness of breath/chest tightness.  May use 15-20 minutes prior to activity.   Monitor frequency of use.    - provided with nebulizer machine today  Asthma control goals:  Full participation in all desired activities (may need albuterol before activity) Albuterol use two time or less a week on average (not counting use with activity) Cough interfering with sleep two time or less a month Oral steroids no more than once a year No hospitalizations   Allergic rhinitis with conjunctivitis  - continue avoidance measures for dust mites, cat dander, dog dander, cockroach, grass pollen, tree pollen, weed pollen, molds  - recommend use of saline rinse to help flush and clean out the  nasal cavity  - use Astelin 2 sprays each nostril twice a day as needed for nasal drainage control  - useFlonase 2 sprays each nostril daily for 1-2 weeks at a time before stopping once nasal congestion improves for maximum benefit  - continue Zyrtec 1-2 tablets a day  - use Bepreve or Optivar 1 drop each eye twice a day as needed for itchy, watery eyes  - she would like to resume allergen immunotherapy.  Discussed traditional route to restart (this is what you have done before) vs RUSH start which gets you to maintenance faster.  Informational handout provided.  Bring Epipen device on days of your injections   Eczema -Daily moisturization after bathing -Apply Elocon ointment once a day as needed to the areas below the neck  Return to clinic in 4-6 months or earlier if problem  I appreciate the opportunity to take part in Yulitza's care. Please do not hesitate to contact me with questions.  Sincerely,  Prudy Feeler, MD Allergy/Immunology [facility]

## 2022-09-05 NOTE — Patient Instructions (Addendum)
Moderate persistent asthma  - Lung function looks great today!  - start Symbicort 170mcg 2 puffs twice a day with spacer device  - continue montelukast 10 mg one tablet once a day  - ProAir HFA 2 puffs or albuterol 1 vial nebulizer every 4-6 hours as needed for cough/wheeze/shortness of breath/chest tightness.  May use 15-20 minutes prior to activity.   Monitor frequency of use.    - provided with nebulizer machine today  Asthma control goals:  Full participation in all desired activities (may need albuterol before activity) Albuterol use two time or less a week on average (not counting use with activity) Cough interfering with sleep two time or less a month Oral steroids no more than once a year No hospitalizations   Allergic rhinitis with conjunctivitis  - continue avoidance measures for dust mites, cat dander, dog dander, cockroach, grass pollen, tree pollen, weed pollen, molds  - recommend use of saline rinse to help flush and clean out the nasal cavity  - use Astelin 2 sprays each nostril twice a day as needed for nasal drainage control  - useFlonase 2 sprays each nostril daily for 1-2 weeks at a time before stopping once nasal congestion improves for maximum benefit  - continue Zyrtec 1-2 tablets a day  - use Bepreve or Optivar 1 drop each eye twice a day as needed for itchy, watery eyes  - she would like to resume allergen immunotherapy.  Discussed traditional route to restart (this is what you have done before) vs RUSH start which gets you to maintenance faster.  Informational handout provided.  Bring Epipen device on days of your injections   Eczema -Daily moisturization after bathing -Apply Elocon ointment once a day as needed to the areas below the neck  Return to clinic in 4-6 months or earlier if problem

## 2022-09-16 ENCOUNTER — Other Ambulatory Visit (HOSPITAL_COMMUNITY): Payer: Self-pay

## 2022-09-22 ENCOUNTER — Telehealth: Payer: Self-pay

## 2022-09-22 NOTE — Telephone Encounter (Signed)
Per Apolonio Schneiders- "Pt was called 2x about scheduling RUSH, on the 3rd attempt I did speak with her and she stated she needed to contact her Ins and she would call back. This was the week of 2/12."

## 2022-09-22 NOTE — Progress Notes (Signed)
Pt was called 2x about scheduling RUSH, on the 3rd attempt I did speak with her and she stated she needed to contact her Ins and she would call back. This was the week of 2/12. I was waiting to hear back from the pt before I sent a message out.

## 2022-09-22 NOTE — Telephone Encounter (Signed)
-----   Message from [place], MD sent at 09/05/2022  1:37 PM EST ----- Pt wants to restart AIT (last injection Aug 2023).  Discussed RUSH as a way to get back to maintenance quicker.  She is interested but has limited Fridays.   Apolonio Schneiders:  can you call and see if she is free on a Friday [place] I will be doing RUSH on and if it works schedule it and then can send to [facility] for premeds.    Will be using her same ITX order.

## 2022-10-01 ENCOUNTER — Telehealth: Payer: Self-pay

## 2022-10-01 NOTE — Telephone Encounter (Signed)
Mychart msg sent

## 2022-10-03 ENCOUNTER — Other Ambulatory Visit (HOSPITAL_COMMUNITY): Payer: Self-pay

## 2022-10-03 DIAGNOSIS — Z419 Encounter for procedure for purposes other than remedying health state, unspecified: Secondary | ICD-10-CM | POA: Diagnosis not present

## 2022-11-03 DIAGNOSIS — Z419 Encounter for procedure for purposes other than remedying health state, unspecified: Secondary | ICD-10-CM | POA: Diagnosis not present

## 2022-11-14 IMAGING — CT CT PELVIS W/ CM
2 of 3 series · 15 of 46 positions shown, 17 images · IV contrast (APPLIED)
Comparison: None.

CLINICAL DATA: Abscess, anal or rectal abscess to proximal right
thigh/groin

EXAM:
CT PELVIS WITH CONTRAST
TECHNIQUE: Multidetector CT imaging of the pelvis was performed using the
standard protocol following the bolus administration of intravenous
contrast.
CONTRAST:  100mL OMNIPAQUE IOHEXOL 300 MG/ML  SOLN

[Series 2: pelvis w · axial · 0.92mm/px · z∈[+780,+1050]mm · 12 of 62 slices shown, 14 images]
[im 4/62  soft-tissue]
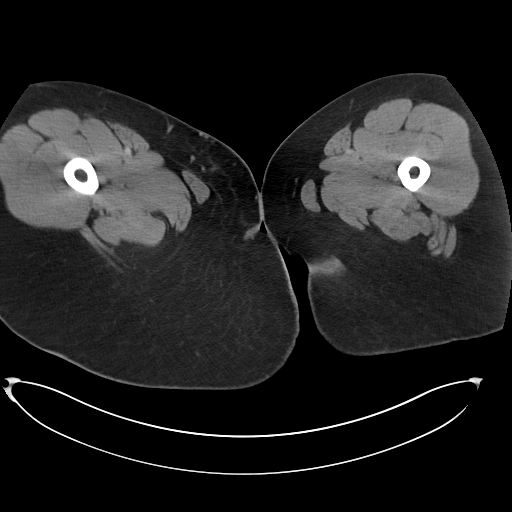
[im 4/62  bone]
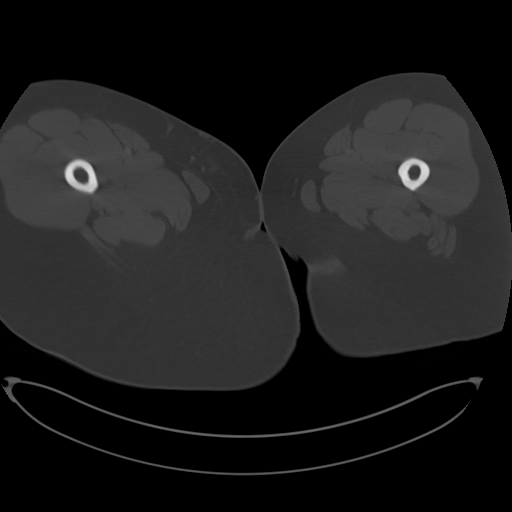
[im 8/62  soft-tissue]
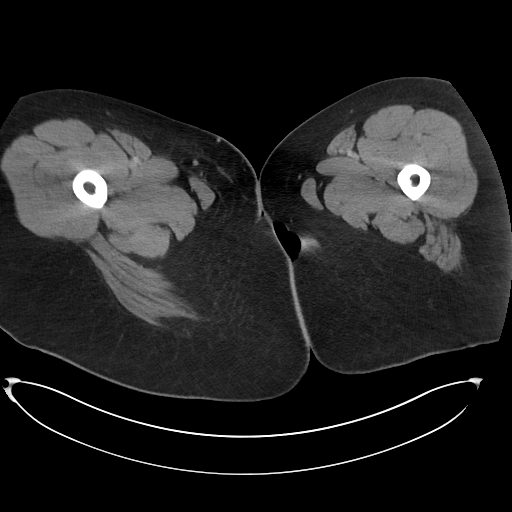
[im 14/62  soft-tissue]
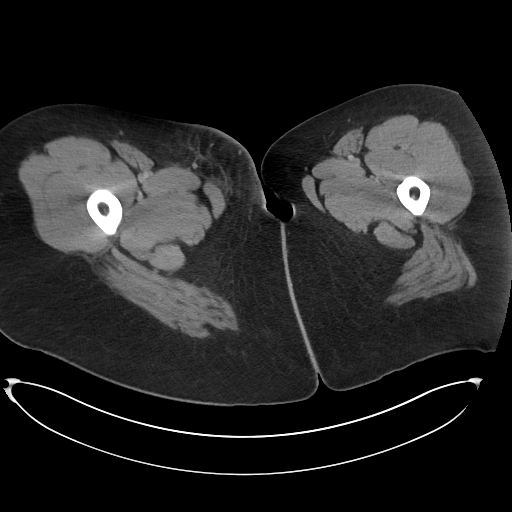
[im 18/62  soft-tissue]
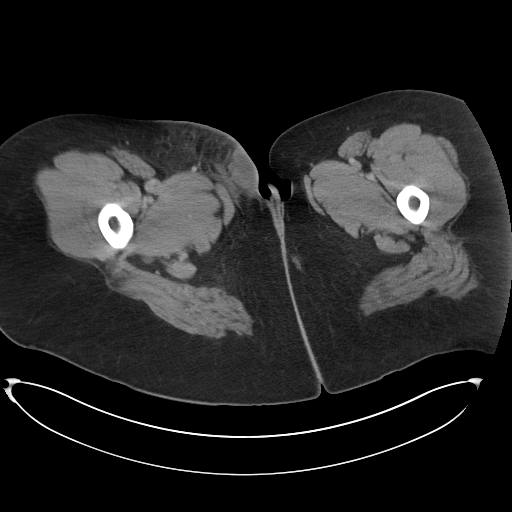
[im 24/62  soft-tissue]
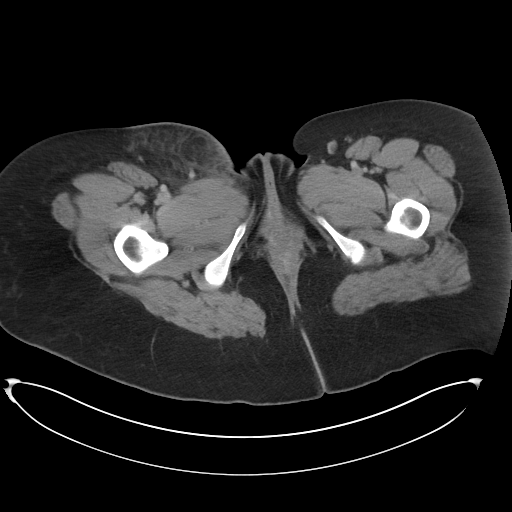
[im 28/62  soft-tissue]
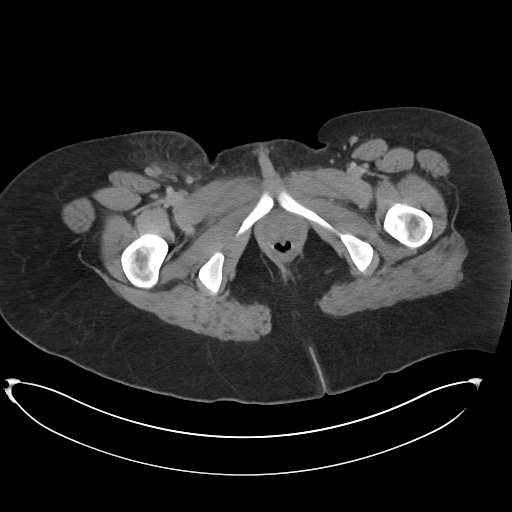
[im 34/62  soft-tissue]
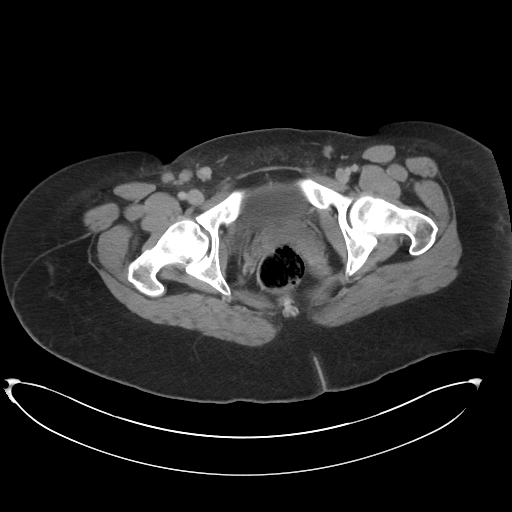
[im 38/62  soft-tissue]
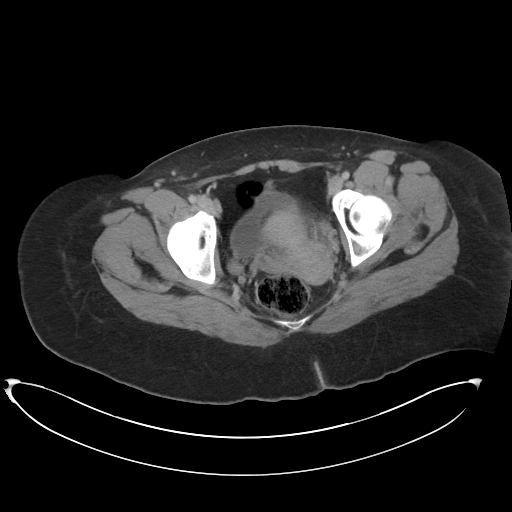
[im 44/62  soft-tissue]
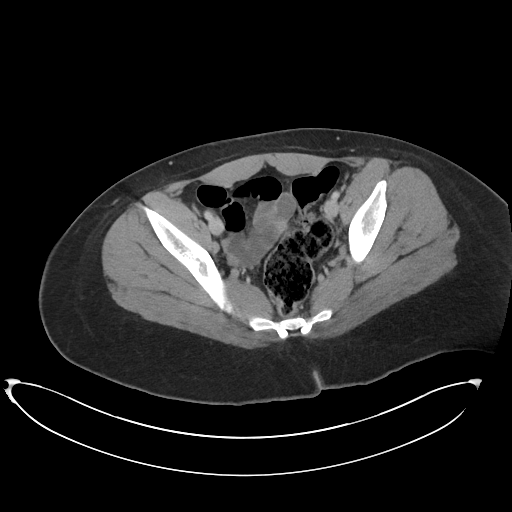
[im 44/62  bone]
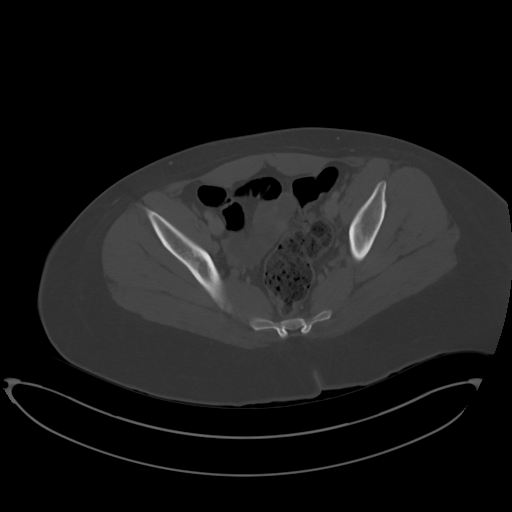
[im 48/62  soft-tissue]
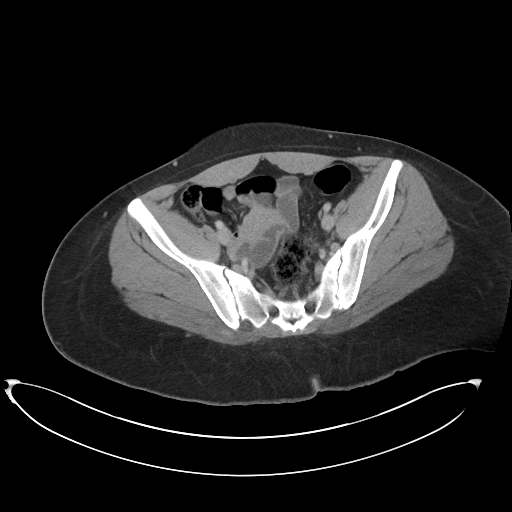
[im 54/62  soft-tissue]
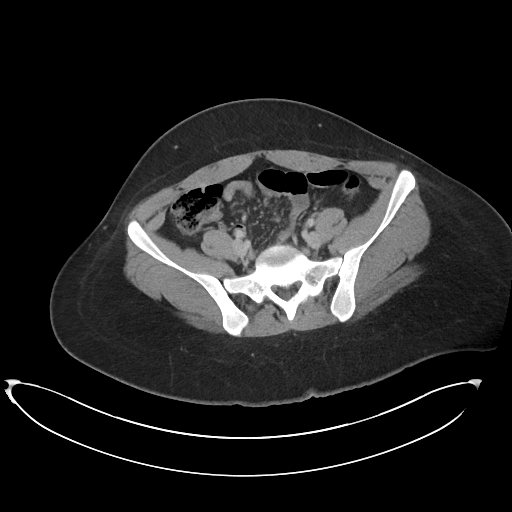
[im 58/62  soft-tissue]
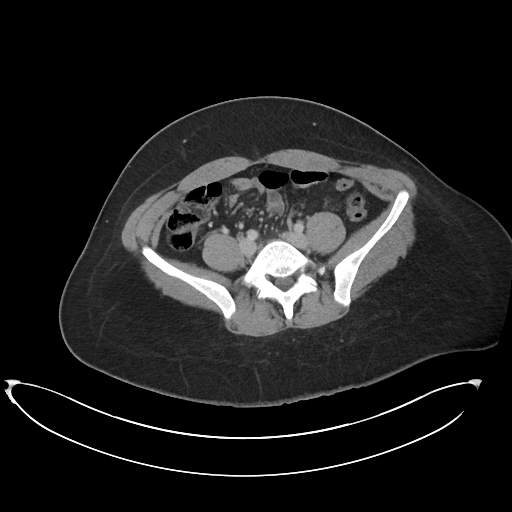

[Series 4: coronal · coronal · 0.64mm/px · 3 of 110 slices shown]
[im 37/110  soft-tissue]
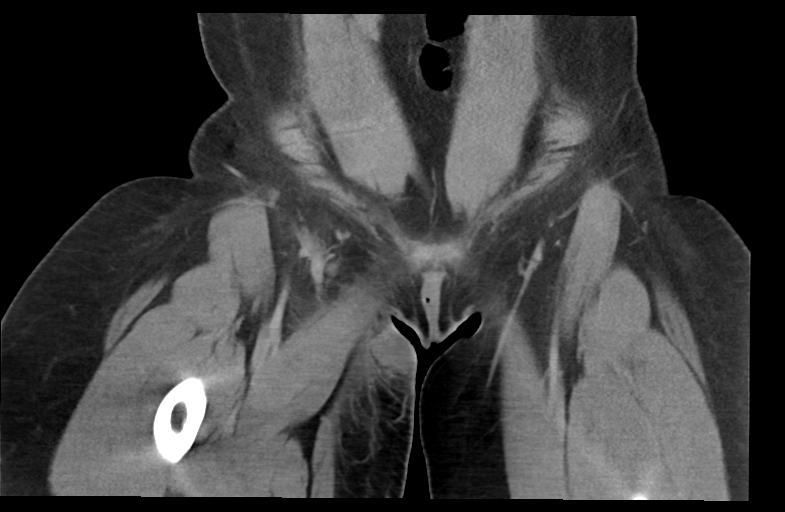
[im 49/110  soft-tissue]
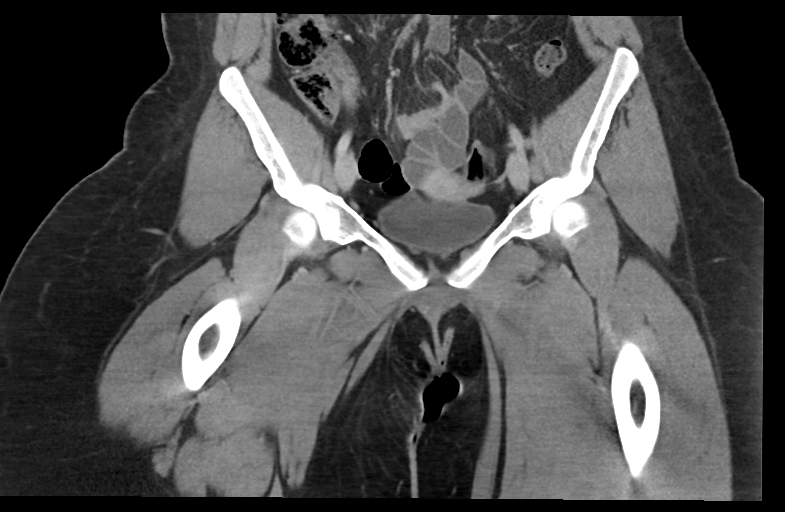
[im 61/110  soft-tissue]
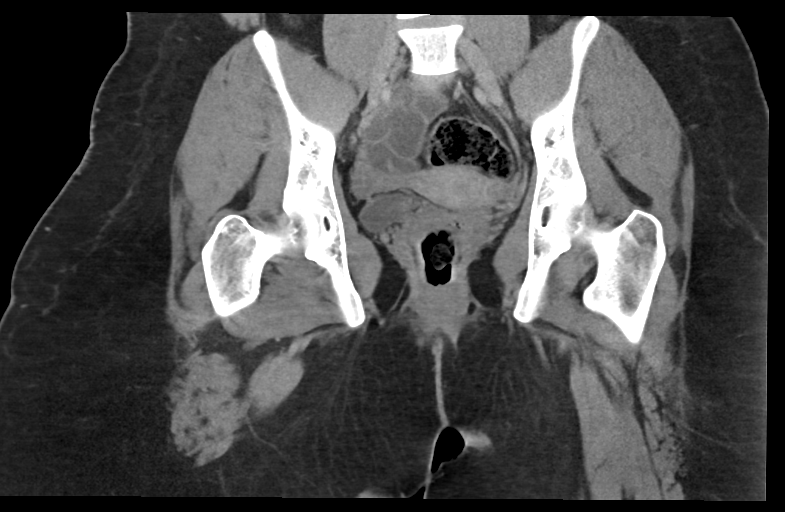

[15 of 46 positions shown; findings below may reference images not displayed]

FINDINGS: Urinary Tract:  No abnormality visualized.

Bowel: Unremarkable visualized pelvic bowel loops. The appendix is
normal.

Vascular/Lymphatic: Prominent right inguinal lymph nodes which are
likely reactive. No significant vascular abnormality is seen.

Reproductive:  Unremarkable.

Other: In the proximal right anteromedial thigh/groin, there is skin
thickening and subcutaneous soft tissue swelling. There is a focal
3.3 x 2.7 x 3.2 cm heterogeneous low-density collection with a
peripheral rim of enhancement along the medial margin of this
inflammatory change (series 2, image 44, coronal image 34). The
there is no extension into the deep muscular compartments or
perineal space.

Musculoskeletal: No acute osseous abnormality. No suspicious lytic
or blastic lesion.
IMPRESSION: Cellulitis of the upper inner right thigh/groin with developing
x 2.7 x 3.2 cm phlegmon/abscess along the medial margin. This
process is superficial involving the subcutaneous fat, without
evidence of extension into the deep muscular compartments or
perineal space.

Reactive right inguinal lymphadenopathy.

## 2022-11-18 ENCOUNTER — Other Ambulatory Visit (HOSPITAL_COMMUNITY): Payer: Self-pay

## 2022-12-02 ENCOUNTER — Other Ambulatory Visit (HOSPITAL_COMMUNITY): Payer: Self-pay

## 2022-12-03 DIAGNOSIS — Z419 Encounter for procedure for purposes other than remedying health state, unspecified: Secondary | ICD-10-CM | POA: Diagnosis not present

## 2022-12-15 ENCOUNTER — Other Ambulatory Visit (HOSPITAL_COMMUNITY): Payer: Self-pay

## 2023-02-10 ENCOUNTER — Other Ambulatory Visit: Payer: Self-pay

## 2023-02-10 ENCOUNTER — Other Ambulatory Visit: Payer: Self-pay | Admitting: Allergy

## 2023-02-10 MED ORDER — ALBUTEROL SULFATE HFA 108 (90 BASE) MCG/ACT IN AERS
2.0000 | INHALATION_SPRAY | RESPIRATORY_TRACT | 1 refills | Status: DC | PRN
  Filled 2023-02-10: qty 6.7, 25d supply, fill #0

## 2023-02-11 ENCOUNTER — Other Ambulatory Visit (HOSPITAL_COMMUNITY): Payer: Self-pay

## 2023-02-12 ENCOUNTER — Other Ambulatory Visit (HOSPITAL_COMMUNITY): Payer: Self-pay

## 2023-03-05 ENCOUNTER — Other Ambulatory Visit (HOSPITAL_COMMUNITY): Payer: Self-pay

## 2023-03-05 ENCOUNTER — Other Ambulatory Visit: Payer: Self-pay | Admitting: Family Medicine

## 2023-03-05 ENCOUNTER — Other Ambulatory Visit: Payer: Self-pay

## 2023-03-05 MED ORDER — METRONIDAZOLE 500 MG PO TABS
500.0000 mg | ORAL_TABLET | Freq: Two times a day (BID) | ORAL | 0 refills | Status: DC
  Filled 2023-03-05: qty 14, 7d supply, fill #0

## 2023-03-05 MED ORDER — IBUPROFEN 600 MG PO TABS
600.0000 mg | ORAL_TABLET | Freq: Three times a day (TID) | ORAL | 1 refills | Status: AC
  Filled 2023-03-05: qty 30, 10d supply, fill #0

## 2023-03-06 ENCOUNTER — Other Ambulatory Visit (HOSPITAL_COMMUNITY): Payer: Self-pay

## 2023-04-15 ENCOUNTER — Other Ambulatory Visit (HOSPITAL_COMMUNITY): Payer: Self-pay

## 2023-04-17 ENCOUNTER — Other Ambulatory Visit (HOSPITAL_COMMUNITY): Payer: Self-pay

## 2023-04-22 ENCOUNTER — Other Ambulatory Visit: Payer: Self-pay | Admitting: *Deleted

## 2023-04-22 ENCOUNTER — Other Ambulatory Visit (HOSPITAL_BASED_OUTPATIENT_CLINIC_OR_DEPARTMENT_OTHER): Payer: Self-pay

## 2023-04-22 DIAGNOSIS — Z3049 Encounter for surveillance of other contraceptives: Secondary | ICD-10-CM

## 2023-04-22 MED ORDER — ETONOGESTREL-ETHINYL ESTRADIOL 0.12-0.015 MG/24HR VA RING
VAGINAL_RING | VAGINAL | 3 refills | Status: DC
  Filled 2023-04-22: qty 1, 28d supply, fill #0

## 2023-04-22 MED ORDER — ETONOGESTREL-ETHINYL ESTRADIOL 0.12-0.015 MG/24HR VA RING: VAGINAL_RING | VAGINAL | 3 refills | Status: DC

## 2023-04-22 NOTE — Progress Notes (Signed)
Sent to wrong pharmacy.

## 2023-04-28 ENCOUNTER — Encounter: Payer: Self-pay | Admitting: Family Medicine

## 2023-10-09 ENCOUNTER — Encounter: Payer: Self-pay | Admitting: Allergy

## 2023-10-09 ENCOUNTER — Ambulatory Visit (INDEPENDENT_AMBULATORY_CARE_PROVIDER_SITE_OTHER): Payer: PRIVATE HEALTH INSURANCE | Admitting: Allergy

## 2023-10-09 VITALS — BP 110/60 | HR 75 | Temp 98.3°F | Resp 18

## 2023-10-09 DIAGNOSIS — L2089 Other atopic dermatitis: Secondary | ICD-10-CM | POA: Diagnosis not present

## 2023-10-09 DIAGNOSIS — J4540 Moderate persistent asthma, uncomplicated: Secondary | ICD-10-CM | POA: Diagnosis not present

## 2023-10-09 DIAGNOSIS — H1010 Acute atopic conjunctivitis, unspecified eye: Secondary | ICD-10-CM

## 2023-10-09 DIAGNOSIS — J309 Allergic rhinitis, unspecified: Secondary | ICD-10-CM | POA: Diagnosis not present

## 2023-10-09 DIAGNOSIS — H1013 Acute atopic conjunctivitis, bilateral: Secondary | ICD-10-CM

## 2023-10-09 MED ORDER — MONTELUKAST SODIUM 10 MG PO TABS: 10.0000 mg | ORAL_TABLET | Freq: Every day | ORAL | 5 refills | Status: DC

## 2023-10-09 MED ORDER — NEBULIZER MISC: 1.0000 | Freq: Every day | 0 refills | Status: AC | PRN

## 2023-10-09 MED ORDER — ALBUTEROL SULFATE HFA 108 (90 BASE) MCG/ACT IN AERS: 2.0000 | INHALATION_SPRAY | RESPIRATORY_TRACT | 1 refills | Status: AC | PRN

## 2023-10-09 MED ORDER — TACROLIMUS 0.1 % EX OINT: TOPICAL_OINTMENT | Freq: Two times a day (BID) | CUTANEOUS | 5 refills | Status: AC | PRN

## 2023-10-09 MED ORDER — AZELASTINE HCL 0.15 % NA SOLN: 2.0000 | Freq: Two times a day (BID) | NASAL | 5 refills | Status: DC

## 2023-10-09 MED ORDER — BUDESONIDE-FORMOTEROL FUMARATE 160-4.5 MCG/ACT IN AERO: 2.0000 | INHALATION_SPRAY | Freq: Two times a day (BID) | RESPIRATORY_TRACT | 5 refills | Status: DC

## 2023-10-09 MED ORDER — MOMETASONE FUROATE 0.1 % EX CREA: TOPICAL_CREAM | Freq: Every day | CUTANEOUS | 3 refills | Status: AC

## 2023-10-09 MED ORDER — MUPIROCIN 2 % EX OINT: 1.0000 | TOPICAL_OINTMENT | Freq: Two times a day (BID) | CUTANEOUS | 0 refills | Status: AC

## 2023-10-09 MED ORDER — EPINEPHRINE 0.3 MG/0.3ML IJ SOAJ: INTRAMUSCULAR | 2 refills | Status: AC

## 2023-10-09 MED ORDER — CETIRIZINE HCL 10 MG PO TABS: 10.0000 mg | ORAL_TABLET | Freq: Every day | ORAL | 5 refills | Status: DC

## 2023-10-09 NOTE — Progress Notes (Signed)
 Follow-up Note  RE: Tiffany Huffman MRN: [id number] DOB: 04-Dec-2000 Date of Office Visit: 10/09/2023   History of present illness: Tiffany Huffman is a 23 y.o. female presenting today for follow-up of asthma, allergic rhinitis with conjunctivitis and eczema.  She was last seen in the office on 09/05/2022 by myself. Discussed the use of AI scribe software for clinical note transcription with the patient, who gave verbal consent to proceed.  She has been experiencing respiratory flare-ups, typically triggered by cold or windy weather. She manages these episodes at home using albuterol breathing treatments. She has not used her Symbicort inhaler for a couple of months due to pharmacy issues and she has been out of it for couple of months. She still has medication for her nebulizer but requires a new machine as her current one is damaged and does not turn on she states.  She also has been out of the montelukast for a couple of months as well.  She has been experiencing eczema flare-ups, particularly on her back and face, with dry patches despite using moisturizer. She has a prescription for a steroid cream, Elocon, for her body but needs a different cream for her face.  She mentions a persistent scab in her nose that has not healed, causing sensitivity and redness. She has not been using any nasal sprays or allergy eye drops, but she uses Zyrtec for allergies and wears contacts, using wetting drops for her eyes.  She is interested in resuming allergy injections but encountered issues with her insurance not covering the [place] protocol start option.  She previously had skin testing before 2017 and blood testing in 2019, which showed positive results for multiple allergens, including dogs.  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     Review of systems: 10pt ROS negative unless noted above in HPI  Past medical/social/surgical/family history have been reviewed and are  unchanged unless specifically indicated below.  No changes  Medication List: Current Outpatient Medications  Medication Sig Dispense Refill   albuterol (PROVENTIL) (2.5 MG/3ML) 0.083% nebulizer solution Take 3 mLs by nebulization every 4 to 6  hours as needed for cough or wheeze. 150 mL 1   albuterol (VENTOLIN HFA) 108 (90 Base) MCG/ACT inhaler Inhale 2 puffs into the lungs every 4 (four) hours as needed for wheezing or shortness of breath 6.7 g 2   albuterol (VENTOLIN HFA) 108 (90 Base) MCG/ACT inhaler Inhale 2 puffs into the lungs every 4 (four) hours as needed for wheezing or shortness of breath. 6.7 g 1   Azelastine HCl 0.15 % SOLN Place 2 sprays into both nostrils 2 (two) times daily. 30 mL 5   budesonide-formoterol (SYMBICORT) 160-4.5 MCG/ACT inhaler Inhale 2 puffs into the lungs 2 (two) times daily. 10.2 g 5   cetirizine (ZYRTEC) 10 MG tablet Take 1 tablet (10 mg total) by mouth daily. 30 tablet 5   EPINEPHrine (EPIPEN 2-PAK) 0.3 mg/0.3 mL IJ SOAJ injection Use as directed for severe allergic reaction 2 each 2   etonogestrel-ethinyl estradiol (NUVARING) 0.12-0.015 MG/24HR vaginal ring Insert vaginally and leave in place for 3 consecutive weeks, then remove for 1 week. 3 each 3   fluticasone (FLONASE) 50 MCG/ACT nasal spray Place 2 sprays into both nostrils daily. 16 g 2   ibuprofen (ADVIL) 600 MG tablet Take 1 tablet (600 mg total) by mouth 3 (three) times daily with food or milk as needed 30 tablet 1   mometasone (ELOCON) 0.1 %  cream Apply topically daily. 45 g 3   montelukast (SINGULAIR) 10 MG tablet Take 1 tablet (10 mg total) by mouth at bedtime. 30 tablet 5   Olopatadine HCl 0.2 % SOLN Place 1 drop into both eyes daily. 2.5 mL 3   No current facility-administered medications for this visit.     Known medication allergies: No Known Allergies   Physical examination: Blood pressure 110/60, pulse 75, temperature 98.3 F (36.8 C), temperature source Temporal, resp. rate 18, SpO2  98%.  General: Alert, interactive, in no acute distress. HEENT: PERRLA, TMs pearly gray, turbinates mildly edematous without discharge, post-pharynx non erythematous. Neck: Supple without lymphadenopathy. Lungs: Clear to auscultation without wheezing, rhonchi or rales. {no increased work of breathing. CV: Normal S1, S2 without murmurs. Abdomen: Nondistended, nontender. Skin: Warm and dry, without lesions or rashes. Extremities:  No clubbing, cyanosis or edema. Neuro:   Grossly intact.  Diagnositics/Labs: Spirometry: FEV1: 2.45 L or 84%, FVC: 3.23 L 96%, ratio consistent with nonobstructive pattern  Assessment and plan: Moderate persistent asthma  - Lung function looks good today!  - Resume Symbicort 2 puffs twice a day with spacer device  - Resume montelukast 10 mg one tablet once a day  - ProAir HFA 2 puffs or albuterol 1 vial nebulizer every 4-6 hours as needed for cough/wheeze/shortness of breath/chest tightness.  May use 15-20 minutes prior to activity.   Monitor frequency of use.    - will prescribe new nebulizer to replace your damaged one  Asthma control goals:  Full participation in all desired activities (may need albuterol before activity) Albuterol use two time or less a week on average (not counting use with activity) Cough interfering with sleep two time or less a month Oral steroids no more than once a year No hospitalizations   Allergic rhinitis with conjunctivitis  - continue avoidance measures for dust mites, cat dander, dog dander, cockroach, grass pollen, tree pollen, weed pollen, molds  - will update allergy panel at this time  - recommend use of saline rinse to help flush and clean out the nasal cavity  - use Astelin 2 sprays each nostril twice a day as needed for nasal drainage control  - useFlonase 2 sprays each nostril daily for 1-2 weeks at a time before stopping once nasal congestion improves for maximum benefit  - continue Zyrtec 1-2 tablets a  day  - use Bepreve or Optivar 1 drop each eye twice a day as needed for itchy, watery eyes  - she would like to resume allergen immunotherapy.  Discussed traditional route to restart (this is what you have done before) vs [facility] start which gets you to maintenance faster.  Insurance codes provided so you can contact your insurance carrier about coverage.  Informational handout provided.  Bring Epipen device on days of your injections   Eczema -Daily moisturization after bathing -Apply Elocon ointment once a day as needed to the areas below the neck -Apply Protopic ointment twice a day as needed to areas on the face -Recommend use Bactroban for the nasal sore for the next couple of weeks  Return to clinic in 4-6 months or earlier if problem    I appreciate the opportunity to take part in Oretta's care. Please do not hesitate to contact me with questions.  Sincerely,   Margo Aye, MD Allergy/Immunology [facility]

## 2023-10-09 NOTE — Patient Instructions (Addendum)
 Moderate persistent asthma  - Lung function looks good today!  - Resume Symbicort 2 puffs twice a day with spacer device  - Resume montelukast 10 mg one tablet once a day  - ProAir HFA 2 puffs or albuterol 1 vial nebulizer every 4-6 hours as needed for cough/wheeze/shortness of breath/chest tightness.  May use 15-20 minutes prior to activity.   Monitor frequency of use.    - will prescribe new nebulizer to replace your damaged one  Asthma control goals:  Full participation in all desired activities (may need albuterol before activity) Albuterol use two time or less a week on average (not counting use with activity) Cough interfering with sleep two time or less a month Oral steroids no more than once a year No hospitalizations   Allergic rhinitis with conjunctivitis  - continue avoidance measures for dust mites, cat dander, dog dander, cockroach, grass pollen, tree pollen, weed pollen, molds  - will update allergy panel at this time  - recommend use of saline rinse to help flush and clean out the nasal cavity  - use Astelin 2 sprays each nostril twice a day as needed for nasal drainage control  - useFlonase 2 sprays each nostril daily for 1-2 weeks at a time before stopping once nasal congestion improves for maximum benefit  - continue Zyrtec 1-2 tablets a day  - use Bepreve or Optivar 1 drop each eye twice a day as needed for itchy, watery eyes  - she would like to resume allergen immunotherapy.  Discussed traditional route to restart (this is what you have done before) vs [facility] start which gets you to maintenance faster.  Insurance codes provided so you can contact your insurance carrier about coverage.  Informational handout provided.  Bring Epipen device on days of your injections   Eczema -Daily moisturization after bathing -Apply Elocon ointment once a day as needed to the areas below the neck -Apply Protopic ointment twice a day as needed to areas on the face -Recommend use  Bactroban for the nasal sore for the next couple of weeks  Return to clinic in 4-6 months or earlier if problem

## 2023-10-14 LAB — ALLERGENS W/TOTAL IGE AREA 2
Alternaria Alternata IgE: 5.99 kU/L — AB
Aspergillus Fumigatus IgE: 2.43 kU/L — AB
Bermuda Grass IgE: 0.28 kU/L — AB
Cat Dander IgE: 2.61 kU/L — AB
Cedar, Mountain IgE: 0.39 kU/L — AB
Cladosporium Herbarum IgE: 3.79 kU/L — AB
Cockroach, German IgE: 0.46 kU/L — AB
Common Silver Birch IgE: 0.36 kU/L — AB
Cottonwood IgE: 0.35 kU/L — AB
D Farinae IgE: 1.88 kU/L — AB
D Pteronyssinus IgE: 1.7 kU/L — AB
Dog Dander IgE: 42 kU/L — AB
Elm, American IgE: 1.09 kU/L — AB
IgE (Immunoglobulin E), Serum: 434 [IU]/mL (ref 6–495)
Johnson Grass IgE: 0.33 kU/L — AB
Maple/Box Elder IgE: 0.25 kU/L — AB
Mouse Urine IgE: <0.1 kU/L
Oak, White IgE: 0.26 kU/L — AB
Pecan, Hickory IgE: 4.66 kU/L — AB
Penicillium Chrysogen IgE: 0.94 kU/L — AB
Pigweed, Rough IgE: 0.39 kU/L — AB
Ragweed, Short IgE: 0.9 kU/L — AB
Sheep Sorrel IgE Qn: 0.39 kU/L — AB
Timothy Grass IgE: 0.37 kU/L — AB
White Mulberry IgE: <0.1 kU/L

## 2023-10-15 ENCOUNTER — Encounter: Payer: Self-pay | Admitting: Allergy

## 2023-12-04 ENCOUNTER — Ambulatory Visit: Admitting: Allergy

## 2024-02-26 ENCOUNTER — Encounter: Payer: Self-pay | Admitting: Family Medicine

## 2024-03-16 ENCOUNTER — Other Ambulatory Visit: Payer: Self-pay | Admitting: Family Medicine

## 2024-03-16 DIAGNOSIS — Z3049 Encounter for surveillance of other contraceptives: Secondary | ICD-10-CM

## 2024-03-22 ENCOUNTER — Encounter: Payer: Self-pay | Admitting: Family Medicine

## 2024-06-02 ENCOUNTER — Encounter: Payer: Self-pay | Admitting: Allergy

## 2024-06-02 ENCOUNTER — Ambulatory Visit: Payer: PRIVATE HEALTH INSURANCE | Admitting: Allergy

## 2024-06-02 ENCOUNTER — Other Ambulatory Visit: Payer: Self-pay

## 2024-06-02 VITALS — BP 118/74 | HR 84 | Temp 97.8°F | Wt 220.3 lb

## 2024-06-02 DIAGNOSIS — H1013 Acute atopic conjunctivitis, bilateral: Secondary | ICD-10-CM | POA: Diagnosis not present

## 2024-06-02 DIAGNOSIS — L2089 Other atopic dermatitis: Secondary | ICD-10-CM | POA: Diagnosis not present

## 2024-06-02 DIAGNOSIS — H1010 Acute atopic conjunctivitis, unspecified eye: Secondary | ICD-10-CM

## 2024-06-02 DIAGNOSIS — J4540 Moderate persistent asthma, uncomplicated: Secondary | ICD-10-CM

## 2024-06-02 DIAGNOSIS — J3089 Other allergic rhinitis: Secondary | ICD-10-CM | POA: Diagnosis not present

## 2024-06-02 MED ORDER — MONTELUKAST SODIUM 10 MG PO TABS: 10.0000 mg | ORAL_TABLET | Freq: Every day | ORAL | 5 refills | Status: AC

## 2024-06-02 MED ORDER — FLUTICASONE PROPIONATE 50 MCG/ACT NA SUSP: 2.0000 | Freq: Every day | NASAL | 2 refills | Status: AC

## 2024-06-02 MED ORDER — METHYLPREDNISOLONE ACETATE 80 MG/ML IJ SUSP
80.0000 mg | Freq: Once | INTRAMUSCULAR | Status: AC
Start: 2024-06-02 — End: 2024-06-02
  Administered 2024-06-02: 80 mg via INTRAMUSCULAR

## 2024-06-02 MED ORDER — BUDESONIDE-FORMOTEROL FUMARATE 160-4.5 MCG/ACT IN AERO
2.0000 | INHALATION_SPRAY | Freq: Two times a day (BID) | RESPIRATORY_TRACT | 5 refills | Status: AC
Start: 2024-06-02 — End: ?

## 2024-06-02 MED ORDER — AZELASTINE HCL 0.15 % NA SOLN
2.0000 | Freq: Two times a day (BID) | NASAL | 5 refills | Status: AC
Start: 2024-06-02 — End: ?

## 2024-06-02 MED ORDER — CETIRIZINE HCL 10 MG PO TABS: 10.0000 mg | ORAL_TABLET | Freq: Every day | ORAL | 5 refills | Status: AC

## 2024-06-02 NOTE — Progress Notes (Signed)
 Follow-up Note  RE: ROSALY LABARBERA MRN: [id number] DOB: [birth year] Date of Office Visit: 06/02/2024   History of present illness: Tiffany Huffman is a 23 y.o. female presenting today for follow-up of allergic rhinitis with conjunctivitis, asthma, eczema.  She was last seen in the office on 10/09/2023 by myself. Discussed the use of AI scribe software for clinical note transcription with the patient, who gave verbal consent to proceed.  Her allergy symptoms have been fluctuating over the past few months, characterized by a dry and itchy throat and eyes, nasal congestion and drainage. She discontinued allergy shots in 2023 due to insurance issues, leading to an increase in symptoms now. She is currently taking Zyrtec  twice daily, which initially provided relief but now requires more frequent dosing due to symptom recurrence by the end of her work shift. She has run out of her eye drops and nasal sprays, specifically Flonase  and azelastine .  She will like to get back on allergy shots.  She states she tried to call her insurance about starting via [facility]USH method however she states they never called her back about the coverage.  She rates her allergy symptoms right now a 7 out of 10 in severity scale.  Her asthma symptoms are rated as a six out of ten in severity. She recalls an incident where she performed CPR and struggled to catch her breath, necessitating a couple of breathing treatments to recover.  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  She uses Symbicort  as her maintenance inhaler but has not been using it consistently. Instead, she has been using a white inhaler, identified as Airsupra, which she received as a sample and uses as a rescue inhaler. She does not currently have a nebulizer as hers is broken and has been borrowing her sister's when needed.     Review of systems: 10pt ROS negative unless noted above in HPI   Past  medical/social/surgical/family history have been reviewed and are unchanged unless specifically indicated below.  No changes  Medication List: Current Outpatient Medications  Medication Sig Dispense Refill   albuterol  (PROVENTIL ) (2.5 MG/3ML) 0.083% nebulizer solution Take 3 mLs by nebulization every 4 to 6  hours as needed for cough or wheeze. 150 mL 1   albuterol  (VENTOLIN  HFA) 108 (90 Base) MCG/ACT inhaler Inhale 2 puffs into the lungs every 4 (four) hours as needed for wheezing or shortness of breath. 18 g 1   Azelastine  HCl 0.15 % SOLN Place 2 sprays into both nostrils 2 (two) times daily. 30 mL 5   budesonide -formoterol  (SYMBICORT ) 160-4.5 MCG/ACT inhaler Inhale 2 puffs into the lungs 2 (two) times daily. 10.2 g 5   buPROPion (WELLBUTRIN XL) 150 MG 24 hr tablet Take 150 mg by mouth daily.     cetirizine  (ZYRTEC ) 10 MG tablet Take 1 tablet (10 mg total) by mouth daily. 30 tablet 5   EPINEPHrine  (EPIPEN  2-PAK) 0.3 mg/0.3 mL IJ SOAJ injection Use as directed for severe allergic reaction (Patient taking differently: Inject 0.3 mg into the muscle as needed. Use as directed for severe allergic reaction) 2 each 2   etonogestrel -ethinyl estradiol  (NUVARING) 0.12-0.015 MG/24HR vaginal ring Insert vaginally and leave in place for 3 consecutive weeks, then remove for 1 week. 3 each 3   fluticasone  (FLONASE ) 50 MCG/ACT nasal spray Place 2 sprays into both nostrils daily. 16 g 2   ibuprofen  (ADVIL ) 600 MG tablet Take 1 tablet (600  mg total) by mouth 3 (three) times daily with food or milk as needed (Patient taking differently: Take 600 mg by mouth as needed.) 30 tablet 1   mometasone  (ELOCON ) 0.1 % cream Apply topically daily. 45 g 3   montelukast  (SINGULAIR ) 10 MG tablet Take 1 tablet (10 mg total) by mouth at bedtime. 30 tablet 5   mupirocin  ointment (BACTROBAN ) 2 % Place 1 Application into the nose 2 (two) times daily. For 1 to 2 weeks for nasal irritation/sore 22 g 0   Nebulizer MISC 1 Device by  Does not apply route daily as needed. 1 each 0   Olopatadine  HCl 0.2 % SOLN Place 1 drop into both eyes daily. 2.5 mL 3   tacrolimus  (PROTOPIC ) 0.1 % ointment Apply topically 2 (two) times daily as needed. 100 g 5   topiramate  (TOPAMAX ) 25 MG tablet Take 25 mg by mouth daily.     No current facility-administered medications for this visit.     Known medication allergies: No Known Allergies   Physical examination: Blood pressure 118/74, pulse 84, temperature 97.8 F (36.6 C), temperature source Temporal, weight 220 lb 4.8 oz (99.9 kg), SpO2 98%.  General: Alert, interactive, in no acute distress. HEENT: PERRLA, TMs pearly gray, turbinates moderately edematous with clear discharge, post-pharynx non erythematous. Neck: Supple without lymphadenopathy. Lungs: Clear to auscultation without wheezing, rhonchi or rales. {no increased work of breathing. CV: Normal S1, S2 without murmurs. Abdomen: Nondistended, nontender. Skin: Warm and dry, without lesions or rashes. Extremities:  No clubbing, cyanosis or edema. Neuro:   Grossly intact.  Diagnostics/Labs: Component     Latest Ref Rng 10/09/2023  IgE (Immunoglobulin E), Serum     6 - 495 IU/mL 434   D Pteronyssinus IgE     Class III kU/L 1.70 !   D Farinae IgE     Class III kU/L 1.88 !   Cat Dander IgE     Class III kU/L 2.61 !   Dog Dander IgE     Class V kU/L 42.00 !   Mouse Urine IgE     Class 0 kU/L <0.10   Bermuda Grass IgE     Class 0/I kU/L 0.28 !   Timothy Grass IgE     Class I kU/L 0.37 !   Johnson Grass IgE     Class I kU/L 0.33 !   Cockroach, German IgE     Class I kU/L 0.46 !   Penicillium Chrysogen IgE     Class II kU/L 0.94 !   Cladosporium Herbarum IgE     Class III kU/L 3.79 !   Aspergillus Fumigatus IgE     Class III kU/L 2.43 !   Alternaria Alternata IgE     Class IV kU/L 5.99 !   Maple/Box Elder IgE     Class 0/I kU/L 0.25 !   Common Silver Valrie IgE     Class I kU/L 0.36 !   Cedar, [place] IgE      Class I kU/L 0.39 !   Oak, [place] IgE     Class 0/I kU/L 0.26 !   Elm, American IgE     Class II kU/L 1.09 !   Cottonwood IgE     Class I kU/L 0.35 !   Pecan, [place] IgE     Class IV kU/L 4.66 !   White Mulberry IgE     Class 0 kU/L <0.10   Ragweed, Short IgE     Class II kU/L  0.90 !   Pigweed, Rough IgE     Class I kU/L 0.39 !   Sheep Sorrel IgE Qn     Class I kU/L 0.39 !     Spirometry: FEV1: 2.92L 100%, FVC: 3.79L 112% ., ratio consistent with nonobstructive pattern  Depo-Medrol 80 mg injection given today in office Assessment and plan:   Moderate persistent asthma  - Lung function looks good today!  - Use Symbicort  160mcg 2 puffs twice a day with spacer device  (this is red and grey inhaler)  - Continue montelukast  10 mg one tablet once a day  - ProAir  HFA 2 puffs or albuterol  1 vial nebulizer every 4-6 hours as needed for cough/wheeze/shortness of breath/chest tightness.  May use 15-20 minutes prior to activity.   Monitor frequency of use.    - provided with nebulizer to replace your damaged one  Asthma control goals:  Full participation in all desired activities (may need albuterol  before activity) Albuterol  use two time or less a week on average (not counting use with activity) Cough interfering with sleep two time or less a month Oral steroids no more than once a year No hospitalizations   Allergic rhinitis with conjunctivitis  - continue avoidance measures for dust mites, cat dander, dog dander, cockroach, grass pollen, tree pollen, weed pollen, molds  - recommend use of saline rinse to help flush and clean out the nasal cavity  - use Astelin  2 sprays each nostril twice a day as needed for nasal drainage control  - use Flonase  2 sprays each nostril daily for 1-2 weeks at a time before stopping once nasal congestion improves for maximum benefit  - continue Zyrtec  1-2 tablets a day  - use Bepreve or Optivar  1 drop each eye twice a day as needed for itchy, watery  eyes  - Schedule new start appointment to restart allergen immunotherapy.  Bring Epipen  device on days of your injections   - Steroid injection given today in office  Eczema -Daily moisturization after bathing -Apply Elocon  ointment once a day as needed to the areas below the neck -Apply Protopic  ointment twice a day as needed to areas on the face -Recommend use Bactroban  for the nasal sore for the next couple of weeks  Return to clinic in 6 months or earlier if problem   I appreciate the opportunity to take part in Abagail's care. Please do not hesitate to contact me with questions.  Sincerely,   Danita Brain, MD Allergy/Immunology [facility]

## 2024-06-02 NOTE — Patient Instructions (Addendum)
 Moderate persistent asthma  - Lung function looks good today!  - Use Symbicort  160mcg 2 puffs twice a day with spacer device  (this is red and grey inhaler)  - Continue montelukast  10 mg one tablet once a day  - ProAir  HFA 2 puffs or albuterol  1 vial nebulizer every 4-6 hours as needed for cough/wheeze/shortness of breath/chest tightness.  May use 15-20 minutes prior to activity.   Monitor frequency of use.    - provided with nebulizer to replace your damaged one  Asthma control goals:  Full participation in all desired activities (may need albuterol  before activity) Albuterol  use two time or less a week on average (not counting use with activity) Cough interfering with sleep two time or less a month Oral steroids no more than once a year No hospitalizations   Allergic rhinitis with conjunctivitis  - continue avoidance measures for dust mites, cat dander, dog dander, cockroach, grass pollen, tree pollen, weed pollen, molds  - recommend use of saline rinse to help flush and clean out the nasal cavity  - use Astelin  2 sprays each nostril twice a day as needed for nasal drainage control  - use Flonase  2 sprays each nostril daily for 1-2 weeks at a time before stopping once nasal congestion improves for maximum benefit  - continue Zyrtec  1-2 tablets a day  - use Bepreve or Optivar  1 drop each eye twice a day as needed for itchy, watery eyes  - Schedule new start appointment to restart allergen immunotherapy.  Bring Epipen  device on days of your injections   - Steroid injection given today in office  Eczema -Daily moisturization after bathing -Apply Elocon  ointment once a day as needed to the areas below the neck -Apply Protopic  ointment twice a day as needed to areas on the face -Recommend use Bactroban  for the nasal sore for the next couple of weeks  Return to clinic in 6 months or earlier if problem

## 2024-06-28 ENCOUNTER — Other Ambulatory Visit: Payer: Self-pay | Admitting: Allergy

## 2024-06-28 DIAGNOSIS — H1010 Acute atopic conjunctivitis, unspecified eye: Secondary | ICD-10-CM

## 2024-07-04 DIAGNOSIS — J302 Other seasonal allergic rhinitis: Secondary | ICD-10-CM | POA: Diagnosis not present

## 2024-07-04 DIAGNOSIS — J3089 Other allergic rhinitis: Secondary | ICD-10-CM | POA: Diagnosis not present

## 2024-07-04 DIAGNOSIS — J3081 Allergic rhinitis due to animal (cat) (dog) hair and dander: Secondary | ICD-10-CM | POA: Diagnosis not present

## 2024-07-04 DIAGNOSIS — J301 Allergic rhinitis due to pollen: Secondary | ICD-10-CM | POA: Diagnosis not present

## 2024-07-04 NOTE — Progress Notes (Signed)
 VIALS MADE ON 07/04/24

## 2024-07-04 NOTE — Progress Notes (Signed)
 Aeroallergen Immunotherapy  Ordering Provider: Danita Brain, MD  Patient Details Name: Tiffany Huffman MRN: [id number] Date of Birth: 2000-09-18  Order 1 of 2  Vial Label: pollen, pet  0.3 ml (Volume)  BAU Concentration -- 7 Grass Mix* 100,000 ([place]  Blue, [place], Orchard, Perennial Rye, RedTop, Sweet Vernal, Timothy) 0.3 ml (Volume)  BAU Concentration -- Bermuda 10,000 0.2 ml (Volume)  1:20 Concentration -- Johnson 0.3 ml (Volume)  1:20 Concentration -- Ragweed Mix 0.5 ml (Volume)  1:20 Concentration -- Weed Mix* 0.5 ml (Volume)  1:20 Concentration -- Eastern 10 Tree Mix* 0.2 ml (Volume)  1:10 Concentration -- Cedar, red 0.2 ml (Volume)  1:10 Concentration -- Pecan Pollen 0.5 ml (Volume)  1:10 Concentration -- Cat Hair 0.5 ml (Volume)  1:10 Concentration -- Dog Epithelia   3.5  ml Extract Subtotal 1.5  ml Diluent  5.0  ml Maintenance Total  Schedule:  B Green Vial (1:1,000): Schedule B (6 doses) Blue Vial (1:100): Schedule B (6 doses) Yellow Vial (1:10): Schedule B (6 doses) Red Vial (1:1): Schedule A (14 doses)  Special Instructions: After completion of the first Red Vial, please space to every two weeks. After completion of the second Red Vial, please space to every 4 weeks. Ok to up dose new vials on Schedule D. Ok to come twice weekly, if desired, as long as there is 48 hours between injections.

## 2024-07-04 NOTE — Progress Notes (Signed)
 Aeroallergen Immunotherapy   Ordering Provider: Danita Brain, MD   Patient Details  Name: Tiffany Huffman  MRN: [id number]  Date of Birth: 09/23/2000   Order 2 of 2   Vial Label: mite, mold, roach   0.2 ml (Volume)  1:20 Concentration -- Alternaria alternata  0.2 ml (Volume)  1:20 Concentration -- Cladosporium herbarum  0.2 ml (Volume)  1:10 Concentration -- Aspergillus mix  0.2 ml (Volume)  1:10 Concentration -- Penicillium mix  0.5 ml (Volume)   AU Concentration -- Mite Mix (DF 5,000 & DP 5,000)  0.3 ml (Volume)  1:20 Concentration -- Cockroach, German    1.6  ml Extract Subtotal  3.4  ml Diluent   5.0  ml Maintenance Total   Schedule:  B  Green Vial (1:1,000): Schedule B (6 doses)  Blue Vial (1:100): Schedule B (6 doses)  Yellow Vial (1:10): Schedule B (6 doses)  Red Vial (1:1): Schedule A (14 doses)   Special Instructions: After completion of the first Red Vial, please space to every two weeks. After completion of the second Red Vial, please space to every 4 weeks. Ok to up dose new vials on Schedule D. Ok to come twice weekly, if desired, as long as there is 48 hours between injections.

## 2024-07-14 ENCOUNTER — Ambulatory Visit (INDEPENDENT_AMBULATORY_CARE_PROVIDER_SITE_OTHER): Payer: PRIVATE HEALTH INSURANCE

## 2024-07-14 DIAGNOSIS — J309 Allergic rhinitis, unspecified: Secondary | ICD-10-CM

## 2024-07-14 DIAGNOSIS — H1010 Acute atopic conjunctivitis, unspecified eye: Secondary | ICD-10-CM

## 2024-07-20 ENCOUNTER — Ambulatory Visit (INDEPENDENT_AMBULATORY_CARE_PROVIDER_SITE_OTHER): Payer: PRIVATE HEALTH INSURANCE

## 2024-07-20 DIAGNOSIS — J309 Allergic rhinitis, unspecified: Secondary | ICD-10-CM

## 2024-07-20 DIAGNOSIS — H1010 Acute atopic conjunctivitis, unspecified eye: Secondary | ICD-10-CM

## 2024-08-03 ENCOUNTER — Ambulatory Visit: Payer: PRIVATE HEALTH INSURANCE

## 2024-08-03 DIAGNOSIS — H1010 Acute atopic conjunctivitis, unspecified eye: Secondary | ICD-10-CM

## 2024-08-03 DIAGNOSIS — J309 Allergic rhinitis, unspecified: Secondary | ICD-10-CM

## 2024-08-19 ENCOUNTER — Ambulatory Visit (INDEPENDENT_AMBULATORY_CARE_PROVIDER_SITE_OTHER)

## 2024-08-19 DIAGNOSIS — J302 Other seasonal allergic rhinitis: Secondary | ICD-10-CM

## 2024-08-24 ENCOUNTER — Ambulatory Visit: Payer: PRIVATE HEALTH INSURANCE | Admitting: Allergy

## 2024-09-02 ENCOUNTER — Ambulatory Visit

## 2024-09-02 DIAGNOSIS — J302 Other seasonal allergic rhinitis: Secondary | ICD-10-CM

## 2024-12-01 ENCOUNTER — Ambulatory Visit: Payer: PRIVATE HEALTH INSURANCE | Admitting: Allergy
